# Patient Record
Sex: Male | Born: 1960 | Race: White | Hispanic: No | Marital: Married | Smoking: Light tobacco smoker
Health system: Southern US, Community
[De-identification: ages and names within clinical notes are randomized; demographics above are authoritative.]

## PROBLEM LIST (undated history)

## (undated) DIAGNOSIS — I499 Cardiac arrhythmia, unspecified: Secondary | ICD-10-CM

## (undated) DIAGNOSIS — E119 Type 2 diabetes mellitus without complications: Secondary | ICD-10-CM

## (undated) DIAGNOSIS — M1990 Unspecified osteoarthritis, unspecified site: Secondary | ICD-10-CM

## (undated) DIAGNOSIS — I10 Essential (primary) hypertension: Secondary | ICD-10-CM

## (undated) HISTORY — PX: COLONOSCOPY: SHX174

## (undated) HISTORY — PX: WISDOM TOOTH EXTRACTION: SHX21

## (undated) HISTORY — PX: CARPAL TUNNEL RELEASE: SHX101

## (undated) HISTORY — PX: VARICOSE VEIN SURGERY: SHX832

---

## 2020-03-11 ENCOUNTER — Ambulatory Visit: Payer: Self-pay

## 2020-03-11 ENCOUNTER — Encounter: Payer: Self-pay | Admitting: Orthopaedic Surgery

## 2020-03-11 ENCOUNTER — Other Ambulatory Visit: Payer: Self-pay

## 2020-03-11 ENCOUNTER — Ambulatory Visit: Payer: BC Managed Care – PPO | Admitting: Orthopaedic Surgery

## 2020-03-11 VITALS — Ht 72.0 in | Wt 348.0 lb

## 2020-03-11 DIAGNOSIS — M25551 Pain in right hip: Secondary | ICD-10-CM

## 2020-03-11 DIAGNOSIS — M1611 Unilateral primary osteoarthritis, right hip: Secondary | ICD-10-CM

## 2020-03-11 NOTE — Progress Notes (Signed)
Office Visit Note   Patient: Jorge Shaw           Date of Birth: March 20, 1961           MRN: [id number] Visit Date: 03/11/2020              Requested by: No referring provider defined for this encounter. PCP: Patient, No Pcp Per   Assessment & Plan: Visit Diagnoses:  1. Right hip pain   2. Unilateral primary osteoarthritis, right hip     Plan: I did talk to him in length about hip replacement surgery.  I discussed the risk and benefits of the surgery in detail.  We talked about anterior hip surgery and the goals of our surgery.  I talked about the heightened risk of implant failure and dislocation as well as soft tissue issues given his morbid obesity.  What I am most encouraged by is the fact that he has lost 100 pounds in just over a year and he has had any in the right direction and is motivated.  I feel comfortable with proceeding with anterior hip surgery at this point based on his clinical exam findings and x-ray findings.  He will let us know if he would like to have this scheduled.  All questions and concerns were answered and addressed.  He has our surgery scheduler's card.    Follow-Up Instructions: Return if symptoms worsen or fail to improve.   Orders:  Orders Placed This Encounter  Procedures  . XR HIP UNILAT W OR W/O PELVIS 1V RIGHT   No orders of the defined types were placed in this encounter.     Procedures: No procedures performed   Clinical Data: No additional findings.   Subjective: No chief complaint on file. The patient is a very pleasant 59 year old gentleman that I am seeing for the first time as it relates to severe end-stage arthritis of his right hip.  He ambulates with a cane.  His pain is debilitating in his right hip and it is 10 out of 10.  It is gotten worse on a daily basis.  His right hip pain is detrimentally affecting his quality of life, his activities day living and his mobility.  He is being seen today as a second opinion.  His other  orthopedic surgeon wants him to be at 300 pounds before considering surgery.  It is noted that the patient is already lost 100 pounds since he began his weight loss journey.  This is been through diet and he works with a specialist for this.  He reports a hemoglobin A1c of below 6.  He has tried and failed all forms conservative treatment as it relates to his right hip pain.  His left hip does not hurt at all  HPI  Review of Systems He currently denies any headache, chest pain, shortness of breath, fever, chills, nausea, vomiting  Objective: Vital Signs: Ht 6' (1.829 m)   Wt (!) 348 lb (157.9 kg)   BMI 47.20 kg/m   Physical Exam He is alert and orient x3 and in no acute distress Ortho Exam I examined him both standing and laying down.  His left hip exam is normal.  His right hip has significant limitations with internal and ex rotation as well as severe pain with rotation.  When I have him lay in a supine position you can absolutely tell that he is lost about amount of weight.  I can easily mobilize his abdomen and he has a  very thin thigh and I feel comfortable with being able to get through the soft tissue planes to get a stable hip replacement in him. Specialty Comments:  No specialty comments available.  Imaging: XR HIP UNILAT W OR W/O PELVIS 1V RIGHT  Result Date: 03/11/2020 An AP pelvis and lateral of the right hip shows severe end-stage arthritis of the right hip.  There is cystic changes in the acetabulum and femoral head as well as complete loss of joint space.    PMFS History: There are no problems to display for this patient.  History reviewed. No pertinent past medical history.  History reviewed. No pertinent family history.  History reviewed. No pertinent surgical history. Social History   Occupational History  . Not on file  Tobacco Use  . Smoking status: Not on file  Substance and Sexual Activity  . Alcohol use: Not on file  . Drug use: Not on file  . Sexual  activity: Not on file

## 2020-04-09 ENCOUNTER — Telehealth: Payer: Self-pay | Admitting: Orthopaedic Surgery

## 2020-04-09 NOTE — Telephone Encounter (Signed)
Pt called stating he needed a note excusing him from work starting 04/20/20 and pt stated hhe would like this note emailed. Pt asked for a call back when it's been sent and would like the note to be sent to both emails provided:  [email or web address] [phone]

## 2020-04-10 NOTE — Telephone Encounter (Signed)
Can you help me and email the note is wrote today in his chart to him please? [email or web address]

## 2020-04-13 NOTE — Telephone Encounter (Signed)
done

## 2020-04-15 ENCOUNTER — Telehealth: Payer: Self-pay | Admitting: Orthopaedic Surgery

## 2020-04-15 NOTE — Telephone Encounter (Signed)
Can you send this to Vern for me please

## 2020-04-15 NOTE — Telephone Encounter (Signed)
Pt called in stating he filled out FMLA paperwork on 04/15/20 and forgot to attach the fax numbers of the places he needed them sent. Pt stated he needed them sent to his job and [employer].  Work Fax# [phone] Insurance Fax# [phone]

## 2020-04-16 ENCOUNTER — Other Ambulatory Visit: Payer: Self-pay | Admitting: Physician Assistant

## 2020-04-16 NOTE — Patient Instructions (Addendum)
DUE TO COVID-19 ONLY ONE VISITOR IS ALLOWED TO COME WITH YOU AND STAY IN THE WAITING ROOM ONLY DURING PRE OP AND PROCEDURE DAY OF SURGERY. THE 1 VISITOR MAY VISIT WITH YOU AFTER SURGERY IN YOUR PRIVATE ROOM DURING VISITING HOURS ONLY!  YOU NEED TO HAVE A COVID 19 TEST ON 04-21-20 @ 11:55 AM, THIS TEST MUST BE DONE BEFORE SURGERY, COME  [address], [address] , [id number].  ([place]) ONCE YOUR COVID TEST IS COMPLETED, PLEASE BEGIN THE QUARANTINE INSTRUCTIONS AS OUTLINED IN YOUR HANDOUT.                [facility]  04/16/2020   Your procedure is scheduled on: 04-24-20   Report to [facility] Main  Entrance    Report to Admitting at 8:30 AM     Call this number if you have problems the morning of surgery [phone]    Remember: MIDNIGHT THE NIGHT PRIOR TO SURGERY. NOTHING BY MOUTH EXCEPT CLEAR LIQUIDS UNTIL 8:00 AM . PLEASE FINISH G2 DRINK PER SURGEON ORDER  WHICH NEEDS TO BE COMPLETED AT 8:00 AM .   CLEAR LIQUID DIET   Foods Allowed                                                                     Foods Excluded  Coffee and tea, regular and decaf                             liquids that you cannot  Plain Jell-O any favor except red or purple                                           see through such as: Fruit ices (not with fruit pulp)                                     milk, soups, orange juice  Iced Popsicles                                    All solid food Carbonated beverages, regular and diet                                    Cranberry, grape and apple juices Sports drinks like Gatorade Lightly seasoned clear broth or consume(fat free) Sugar, honey syrup   _____________________________________________________________________     Take these medicines the morning of surgery with A SIP OF WATER:  Finasteride (Proscar), Gabapentin (Neurontin), and Tamsulosin  BRUSH YOUR TEETH MORNING OF SURGERY AND RINSE YOUR MOUTH OUT, NO CHEWING GUM  CANDY OR MINTS.                              You may not have any metal on your body including hair pins and  piercings     Do not wear jewelry, cologne, lotions, powders or deodorant                          Men may shave face and neck.   Do not bring valuables to the hospital. [facility] IS NOT             RESPONSIBLE   FOR VALUABLES.  Contacts, dentures or bridgework may not be worn into surgery.  You may bring overnight bag      Special Instructions: N/A              Please read over the following fact sheets you were given: _____________________________________________________________________             [facility] - Preparing for Surgery Before surgery, you can play an important role.  Because skin is not sterile, your skin needs to be as free of germs as possible.  You can reduce the number of germs on your skin by washing with CHG (chlorahexidine gluconate) soap before surgery.  CHG is an antiseptic cleaner which kills germs and bonds with the skin to continue killing germs even after washing. Please DO NOT use if you have an allergy to CHG or antibacterial soaps.  If your skin becomes reddened/irritated stop using the CHG and inform your nurse when you arrive at Short Stay. Do not shave (including legs and underarms) for at least 48 hours prior to the first CHG shower.  You may shave your face/neck. Please follow these instructions carefully:  1.  Shower with CHG Soap the night before surgery and the  morning of Surgery.  2.  If you choose to wash your hair, wash your hair first as usual with your  normal  shampoo.  3.  After you shampoo, rinse your hair and body thoroughly to remove the  shampoo.                           4.  Use CHG as you would any other liquid soap.  You can apply chg directly  to the skin and wash                       Gently with a scrungie or clean washcloth.  5.  Apply the CHG Soap to your body ONLY FROM THE NECK DOWN.   Do not use on  face/ open                           Wound or open sores. Avoid contact with eyes, ears mouth and genitals (private parts).                       Wash face,  Genitals (private parts) with your normal soap.             6.  Wash thoroughly, paying special attention to the area where your surgery  will be performed.  7.  Thoroughly rinse your body with warm water from the neck down.  8.  DO NOT shower/wash with your normal soap after using and rinsing off  the CHG Soap.                9.  Pat yourself dry with a clean towel.            10.  Wear clean pajamas.            11.  Place clean sheets on your bed the night of your first shower and do not  sleep with pets. Day of Surgery : Do not apply any lotions/deodorants the morning of surgery.  Please wear clean clothes to the hospital/surgery center.  FAILURE TO FOLLOW THESE INSTRUCTIONS MAY RESULT IN THE CANCELLATION OF YOUR SURGERY PATIENT SIGNATURE_________________________________  NURSE SIGNATURE__________________________________  ________________________________________________________________________   Rogelia Mire  An incentive spirometer is a tool that can help keep your lungs clear and active. This tool measures how well you are filling your lungs with each breath. Taking long deep breaths may help reverse or decrease the chance of developing breathing (pulmonary) problems (especially infection) following:  A long period of time when you are unable to move or be active. BEFORE THE PROCEDURE   If the spirometer includes an indicator to show your best effort, your nurse or respiratory therapist will set it to a desired goal.  If possible, sit up straight or lean slightly forward. Try not to slouch.  Hold the incentive spirometer in an upright position. INSTRUCTIONS FOR USE  1. Sit on the edge of your bed if possible, or sit up as far as you can in bed or on a chair. 2. Hold the incentive spirometer in an upright  position. 3. Breathe out normally. 4. Place the mouthpiece in your mouth and seal your lips tightly around it. 5. Breathe in slowly and as deeply as possible, raising the piston or the ball toward the top of the column. 6. Hold your breath for 3-5 seconds or for as long as possible. Allow the piston or ball to fall to the bottom of the column. 7. Remove the mouthpiece from your mouth and breathe out normally. 8. Rest for a few seconds and repeat Steps 1 through 7 at least 10 times every 1-2 hours when you are awake. Take your time and take a few normal breaths between deep breaths. 9. The spirometer may include an indicator to show your best effort. Use the indicator as a goal to work toward during each repetition. 10. After each set of 10 deep breaths, practice coughing to be sure your lungs are clear. If you have an incision (the cut made at the time of surgery), support your incision when coughing by placing a pillow or rolled up towels firmly against it. Once you are able to get out of bed, walk around indoors and cough well. You may stop using the incentive spirometer when instructed by your caregiver.  RISKS AND COMPLICATIONS  Take your time so you do not get dizzy or light-headed.  If you are in pain, you may need to take or ask for pain medication before doing incentive spirometry. It is harder to take a deep breath if you are having pain. AFTER USE  Rest and breathe slowly and easily.  It can be helpful to keep track of a log of your progress. Your caregiver can provide you with a simple table to help with this. If you are using the spirometer at home, follow these instructions: SEEK MEDICAL CARE IF:   You are having difficultly using the spirometer.  You have trouble using the spirometer as often as instructed.  Your pain medication is not giving enough relief while using the spirometer.  You develop fever of 100.5 F (38.1 C) or higher. SEEK IMMEDIATE MEDICAL CARE IF:    You cough up bloody sputum that  had not been present before.  You develop fever of 102 F (38.9 C) or greater.  You develop worsening pain at or near the incision site. MAKE SURE YOU:   Understand these instructions.  Will watch your condition.  Will get help right away if you are not doing well or get worse. Document Released: 04/03/2007 Document Revised: 02/13/2012 Document Reviewed: 06/04/2007 ExitCare Patient Information 2014 ExitCare, [place].   ________________________________________________________________________  WHAT IS A BLOOD TRANSFUSION? Blood Transfusion Information  A transfusion is the replacement of blood or some of its parts. Blood is made up of multiple cells which provide different functions.  Red blood cells carry oxygen and are used for blood loss replacement.  White blood cells fight against infection.  Platelets control bleeding.  Plasma helps clot blood.  Other blood products are available for specialized needs, such as hemophilia or other clotting disorders. BEFORE THE TRANSFUSION  Who gives blood for transfusions?   Healthy volunteers who are fully evaluated to make sure their blood is safe. This is blood bank blood. Transfusion therapy is the safest it has ever been in the practice of medicine. Before blood is taken from a donor, a complete history is taken to make sure that person has no history of diseases nor engages in risky social behavior (examples are intravenous drug use or sexual activity with multiple partners). The donor's travel history is screened to minimize risk of transmitting infections, such as malaria. The donated blood is tested for signs of infectious diseases, such as HIV and hepatitis. The blood is then tested to be sure it is compatible with you in order to minimize the chance of a transfusion reaction. If you or a relative donates blood, this is often done in anticipation of surgery and is not appropriate for emergency  situations. It takes many days to process the donated blood. RISKS AND COMPLICATIONS Although transfusion therapy is very safe and saves many lives, the main dangers of transfusion include:   Getting an infectious disease.  Developing a transfusion reaction. This is an allergic reaction to something in the blood you were given. Every precaution is taken to prevent this. The decision to have a blood transfusion has been considered carefully by your caregiver before blood is given. Blood is not given unless the benefits outweigh the risks. AFTER THE TRANSFUSION  Right after receiving a blood transfusion, you will usually feel much better and more energetic. This is especially true if your red blood cells have gotten low (anemic). The transfusion raises the level of the red blood cells which carry oxygen, and this usually causes an energy increase.  The nurse administering the transfusion will monitor you carefully for complications. HOME CARE INSTRUCTIONS  No special instructions are needed after a transfusion. You may find your energy is better. Speak with your caregiver about any limitations on activity for underlying diseases you may have. SEEK MEDICAL CARE IF:   Your condition is not improving after your transfusion.  You develop redness or irritation at the intravenous (IV) site. SEEK IMMEDIATE MEDICAL CARE IF:  Any of the following symptoms occur over the next 12 hours:  Shaking chills.  You have a temperature by mouth above 102 F (38.9 C), not controlled by medicine.  Chest, back, or muscle pain.  People around you feel you are not acting correctly or are confused.  Shortness of breath or difficulty breathing.  Dizziness and fainting.  You get a rash or develop hives.  You have a decrease in urine  output.  Your urine turns a dark color or changes to pink, red, or brown. Any of the following symptoms occur over the next 10 days:  You have a temperature by mouth above  102 F (38.9 C), not controlled by medicine.  Shortness of breath.  Weakness after normal activity.  The white part of the eye turns yellow (jaundice).  You have a decrease in the amount of urine or are urinating less often.  Your urine turns a dark color or changes to pink, red, or brown. Document Released: 11/18/2000 Document Revised: 02/13/2012 Document Reviewed: 07/07/2008 [facility] Patient Information 2014 [place], [place].  _______________________________________________________________________

## 2020-04-16 NOTE — Progress Notes (Signed)
PCP - Dr. Leontine Locket [facility]  Cardiologist - Dr/ Asif Willeen Cass LOV 01-24-20  Chest x-ray -  EKG - 01-24-20 Care Everywhere Stress Test -  ECHO - 3-31 21 Care Everywhere Cardiac Cath -   Sleep Study -  CPAP -   Fasting Blood Sugar -  Checks Blood Sugar _____ times a day  Blood Thinner Instructions: Aspirin Instructions:81 mg ASA Last Dose:  Anesthesia review:   Patient denies shortness of breath, fever, cough and chest pain at PAT appointment   Patient verbalized understanding of instructions that were given to them at the PAT appointment. Patient was also instructed that they will need to review over the PAT instructions again at home before surgery.

## 2020-04-20 ENCOUNTER — Other Ambulatory Visit: Payer: Self-pay

## 2020-04-21 ENCOUNTER — Other Ambulatory Visit (HOSPITAL_COMMUNITY)
Admission: RE | Admit: 2020-04-21 | Discharge: 2020-04-21 | Disposition: A | Discharge status: Home / Self Care | Payer: BC Managed Care – PPO | Source: Ambulatory Visit | Attending: Orthopaedic Surgery | Admitting: Orthopaedic Surgery

## 2020-04-21 ENCOUNTER — Encounter (HOSPITAL_COMMUNITY)
Admission: RE | Admit: 2020-04-21 | Discharge: 2020-04-21 | Disposition: A | Discharge status: Home / Self Care | Payer: BC Managed Care – PPO | Source: Ambulatory Visit | Attending: Orthopaedic Surgery | Admitting: Orthopaedic Surgery

## 2020-04-21 ENCOUNTER — Encounter (HOSPITAL_COMMUNITY): Payer: Self-pay

## 2020-04-21 ENCOUNTER — Other Ambulatory Visit: Payer: Self-pay

## 2020-04-21 DIAGNOSIS — Z01818 Encounter for other preprocedural examination: Secondary | ICD-10-CM | POA: Insufficient documentation

## 2020-04-21 DIAGNOSIS — Z20822 Contact with and (suspected) exposure to covid-19: Secondary | ICD-10-CM | POA: Insufficient documentation

## 2020-04-21 HISTORY — DX: Essential (primary) hypertension: I10

## 2020-04-21 HISTORY — DX: Cardiac arrhythmia, unspecified: I49.9

## 2020-04-21 HISTORY — DX: Unspecified osteoarthritis, unspecified site: M19.90

## 2020-04-21 HISTORY — DX: Type 2 diabetes mellitus without complications: E11.9

## 2020-04-21 LAB — CBC
HCT: 43.3 % (ref 39.0–52.0)
Hemoglobin: 14.8 g/dL (ref 13.0–17.0)
MCH: 29.9 pg (ref 26.0–34.0)
MCHC: 34.2 g/dL (ref 30.0–36.0)
MCV: 87.5 fL (ref 80.0–100.0)
Platelets: 208 10*3/uL (ref 150–400)
RBC: 4.95 MIL/uL (ref 4.22–5.81)
RDW: 14.6 % (ref 11.5–15.5)
WBC: 9.8 10*3/uL (ref 4.0–10.5)
nRBC: 0 % (ref 0.0–0.2)

## 2020-04-21 LAB — BASIC METABOLIC PANEL
Anion gap: 9 (ref 5–15)
BUN: 18 mg/dL (ref 8–23)
CO2: 30 mmol/L (ref 22–32)
Calcium: 9 mg/dL (ref 8.9–10.3)
Chloride: 104 mmol/L (ref 98–111)
Creatinine, Ser: 0.94 mg/dL (ref 0.61–1.24)
GFR calc Af Amer: >60 mL/min (ref >60)
GFR calc non Af Amer: >60 mL/min (ref >60)
Glucose, Bld: 101 mg/dL — ABNORMAL HIGH (ref 70–99)
Potassium: 3.1 mmol/L — ABNORMAL LOW (ref 3.5–5.1)
Sodium: 143 mmol/L (ref 135–145)

## 2020-04-21 LAB — SURGICAL PCR SCREEN
MRSA, PCR: NEGATIVE
Staphylococcus aureus: NEGATIVE

## 2020-04-21 LAB — ABO/RH: ABO/RH(D): B POS

## 2020-04-21 LAB — SARS CORONAVIRUS 2 (TAT 6-24 HRS): SARS Coronavirus 2: NEGATIVE

## 2020-04-22 LAB — HEMOGLOBIN A1C
Hgb A1c MFr Bld: 5.4 % (ref 4.8–5.6)
Mean Plasma Glucose: 108 mg/dL

## 2020-04-23 MED ORDER — DEXTROSE 5 % IV SOLN
3.0000 g | INTRAVENOUS | Status: AC
  Administered 2020-04-24: 3 g via INTRAVENOUS
  Filled 2020-04-23: qty 3

## 2020-04-24 ENCOUNTER — Inpatient Hospital Stay (HOSPITAL_COMMUNITY): Payer: BC Managed Care – PPO | Admitting: Physician Assistant

## 2020-04-24 ENCOUNTER — Inpatient Hospital Stay (HOSPITAL_COMMUNITY): Payer: BC Managed Care – PPO

## 2020-04-24 ENCOUNTER — Encounter (HOSPITAL_COMMUNITY)
Admission: RE | Disposition: A | Discharge status: Home / Self Care | Payer: Self-pay | Source: Home / Self Care | Attending: Orthopaedic Surgery

## 2020-04-24 ENCOUNTER — Inpatient Hospital Stay (HOSPITAL_COMMUNITY)
Admission: RE | Admit: 2020-04-24 | Discharge: 2020-04-26 | DRG: 470 | Disposition: A | Discharge status: Home Health | Payer: BC Managed Care – PPO | Attending: Orthopaedic Surgery | Admitting: Orthopaedic Surgery

## 2020-04-24 ENCOUNTER — Other Ambulatory Visit: Payer: Self-pay

## 2020-04-24 ENCOUNTER — Encounter (HOSPITAL_COMMUNITY): Payer: Self-pay | Admitting: Orthopaedic Surgery

## 2020-04-24 DIAGNOSIS — Z6841 Body Mass Index (BMI) 40.0 and over, adult: Secondary | ICD-10-CM | POA: Diagnosis not present

## 2020-04-24 DIAGNOSIS — Z79899 Other long term (current) drug therapy: Secondary | ICD-10-CM

## 2020-04-24 DIAGNOSIS — Z20822 Contact with and (suspected) exposure to covid-19: Secondary | ICD-10-CM | POA: Diagnosis present

## 2020-04-24 DIAGNOSIS — Z96641 Presence of right artificial hip joint: Secondary | ICD-10-CM

## 2020-04-24 DIAGNOSIS — Z7982 Long term (current) use of aspirin: Secondary | ICD-10-CM

## 2020-04-24 DIAGNOSIS — M1711 Unilateral primary osteoarthritis, right knee: Secondary | ICD-10-CM | POA: Diagnosis present

## 2020-04-24 DIAGNOSIS — M1611 Unilateral primary osteoarthritis, right hip: Secondary | ICD-10-CM | POA: Diagnosis present

## 2020-04-24 DIAGNOSIS — I10 Essential (primary) hypertension: Secondary | ICD-10-CM | POA: Diagnosis present

## 2020-04-24 DIAGNOSIS — E119 Type 2 diabetes mellitus without complications: Secondary | ICD-10-CM | POA: Diagnosis present

## 2020-04-24 DIAGNOSIS — Z419 Encounter for procedure for purposes other than remedying health state, unspecified: Secondary | ICD-10-CM

## 2020-04-24 DIAGNOSIS — F17210 Nicotine dependence, cigarettes, uncomplicated: Secondary | ICD-10-CM | POA: Diagnosis present

## 2020-04-24 HISTORY — PX: TOTAL HIP ARTHROPLASTY: SHX124

## 2020-04-24 LAB — GLUCOSE, CAPILLARY
Glucose-Capillary: 105 mg/dL — ABNORMAL HIGH (ref 70–99)
Glucose-Capillary: 143 mg/dL — ABNORMAL HIGH (ref 70–99)

## 2020-04-24 LAB — TYPE AND SCREEN
ABO/RH(D): B POS
Antibody Screen: NEGATIVE

## 2020-04-24 SURGERY — ARTHROPLASTY, HIP, TOTAL, ANTERIOR APPROACH
Anesthesia: General | Site: Hip | Laterality: Right

## 2020-04-24 MED ORDER — PROPOFOL 500 MG/50ML IV EMUL
INTRAVENOUS | Status: AC
  Filled 2020-04-24: qty 50

## 2020-04-24 MED ORDER — DIPHENHYDRAMINE HCL 12.5 MG/5ML PO ELIX: 12.5000 mg | ORAL_SOLUTION | ORAL | Status: DC | PRN

## 2020-04-24 MED ORDER — ROCURONIUM BROMIDE 10 MG/ML (PF) SYRINGE
PREFILLED_SYRINGE | INTRAVENOUS | Status: DC | PRN
  Administered 2020-04-24 (×2): 30 mg via INTRAVENOUS

## 2020-04-24 MED ORDER — PROPOFOL 10 MG/ML IV BOLUS
INTRAVENOUS | Status: DC | PRN
  Administered 2020-04-24: 200 mg via INTRAVENOUS
  Administered 2020-04-24: 70 mg via INTRAVENOUS

## 2020-04-24 MED ORDER — EPHEDRINE 5 MG/ML INJ
INTRAVENOUS | Status: AC
  Filled 2020-04-24: qty 10

## 2020-04-24 MED ORDER — PANTOPRAZOLE SODIUM 40 MG PO TBEC
40.0000 mg | DELAYED_RELEASE_TABLET | Freq: Every day | ORAL | Status: DC
  Administered 2020-04-24 – 2020-04-26 (×3): 40 mg via ORAL
  Filled 2020-04-24 (×3): qty 1

## 2020-04-24 MED ORDER — DEXAMETHASONE SODIUM PHOSPHATE 10 MG/ML IJ SOLN
INTRAMUSCULAR | Status: DC | PRN
  Administered 2020-04-24: 8 mg via INTRAVENOUS

## 2020-04-24 MED ORDER — POTASSIUM CHLORIDE ER 10 MEQ PO TBCR
10.0000 meq | EXTENDED_RELEASE_TABLET | Freq: Every day | ORAL | Status: DC
  Administered 2020-04-25 – 2020-04-26 (×2): 10 meq via ORAL
  Filled 2020-04-24 (×4): qty 1

## 2020-04-24 MED ORDER — LIDOCAINE 2% (20 MG/ML) 5 ML SYRINGE
INTRAMUSCULAR | Status: AC
  Filled 2020-04-24: qty 5

## 2020-04-24 MED ORDER — KETOROLAC TROMETHAMINE 15 MG/ML IJ SOLN
INTRAMUSCULAR | Status: AC
  Administered 2020-04-24: 15 mg via INTRAVENOUS
  Filled 2020-04-24: qty 1

## 2020-04-24 MED ORDER — ACETAMINOPHEN 325 MG PO TABS
325.0000 mg | ORAL_TABLET | Freq: Four times a day (QID) | ORAL | Status: DC | PRN
  Administered 2020-04-26: 650 mg via ORAL
  Filled 2020-04-24: qty 2

## 2020-04-24 MED ORDER — CEFAZOLIN SODIUM-DEXTROSE 2-4 GM/100ML-% IV SOLN
2.0000 g | Freq: Four times a day (QID) | INTRAVENOUS | Status: AC
  Administered 2020-04-24 (×2): 2 g via INTRAVENOUS
  Filled 2020-04-24 (×2): qty 100

## 2020-04-24 MED ORDER — 0.9 % SODIUM CHLORIDE (POUR BTL) OPTIME
TOPICAL | Status: DC | PRN
  Administered 2020-04-24: 1000 mL

## 2020-04-24 MED ORDER — ONDANSETRON HCL 4 MG PO TABS: 4.0000 mg | ORAL_TABLET | Freq: Four times a day (QID) | ORAL | Status: DC | PRN

## 2020-04-24 MED ORDER — METHOCARBAMOL 500 MG IVPB - SIMPLE MED
INTRAVENOUS | Status: AC
  Administered 2020-04-24: 500 mg via INTRAVENOUS
  Filled 2020-04-24: qty 50

## 2020-04-24 MED ORDER — HYDROMORPHONE HCL 1 MG/ML IJ SOLN
0.2500 mg | INTRAMUSCULAR | Status: DC | PRN
  Administered 2020-04-24 (×2): 0.5 mg via INTRAVENOUS

## 2020-04-24 MED ORDER — GABAPENTIN 400 MG PO CAPS
1200.0000 mg | ORAL_CAPSULE | Freq: Three times a day (TID) | ORAL | Status: DC
  Administered 2020-04-24 – 2020-04-26 (×6): 1200 mg via ORAL
  Filled 2020-04-24 (×6): qty 3

## 2020-04-24 MED ORDER — OXYCODONE HCL 5 MG PO TABS
5.0000 mg | ORAL_TABLET | ORAL | Status: DC | PRN
  Administered 2020-04-24 – 2020-04-25 (×3): 10 mg via ORAL
  Filled 2020-04-24 (×3): qty 2

## 2020-04-24 MED ORDER — METHOCARBAMOL 500 MG PO TABS
500.0000 mg | ORAL_TABLET | Freq: Four times a day (QID) | ORAL | Status: DC | PRN
  Administered 2020-04-25 – 2020-04-26 (×4): 500 mg via ORAL
  Filled 2020-04-24 (×4): qty 1

## 2020-04-24 MED ORDER — PHENOL 1.4 % MT LIQD: 1.0000 | OROMUCOSAL | Status: DC | PRN

## 2020-04-24 MED ORDER — LOSARTAN POTASSIUM 50 MG PO TABS
100.0000 mg | ORAL_TABLET | Freq: Every day | ORAL | Status: DC
  Administered 2020-04-25 – 2020-04-26 (×2): 100 mg via ORAL
  Filled 2020-04-24 (×2): qty 2

## 2020-04-24 MED ORDER — SUGAMMADEX SODIUM 500 MG/5ML IV SOLN
INTRAVENOUS | Status: DC | PRN
  Administered 2020-04-24: 300 mg via INTRAVENOUS

## 2020-04-24 MED ORDER — HYDROMORPHONE HCL 1 MG/ML IJ SOLN
INTRAMUSCULAR | Status: AC
  Filled 2020-04-24: qty 1

## 2020-04-24 MED ORDER — EPHEDRINE SULFATE-NACL 50-0.9 MG/10ML-% IV SOSY
PREFILLED_SYRINGE | INTRAVENOUS | Status: DC | PRN
  Administered 2020-04-24: 10 mg via INTRAVENOUS

## 2020-04-24 MED ORDER — METOPROLOL SUCCINATE ER 100 MG PO TB24
100.0000 mg | ORAL_TABLET | Freq: Once | ORAL | Status: AC
  Administered 2020-04-24: 100 mg via ORAL
  Filled 2020-04-24: qty 1

## 2020-04-24 MED ORDER — SODIUM CHLORIDE 0.9 % IR SOLN
Status: DC | PRN
  Administered 2020-04-24: 1000 mL

## 2020-04-24 MED ORDER — LIDOCAINE 2% (20 MG/ML) 5 ML SYRINGE
INTRAMUSCULAR | Status: DC | PRN
  Administered 2020-04-24: 90 mg via INTRAVENOUS

## 2020-04-24 MED ORDER — CHLORHEXIDINE GLUCONATE 0.12 % MT SOLN
15.0000 mL | Freq: Once | OROMUCOSAL | Status: AC
  Administered 2020-04-24: 15 mL via OROMUCOSAL

## 2020-04-24 MED ORDER — ASPIRIN 81 MG PO CHEW
81.0000 mg | CHEWABLE_TABLET | Freq: Two times a day (BID) | ORAL | Status: DC
  Administered 2020-04-24 – 2020-04-26 (×4): 81 mg via ORAL
  Filled 2020-04-24 (×4): qty 1

## 2020-04-24 MED ORDER — DOCUSATE SODIUM 100 MG PO CAPS
100.0000 mg | ORAL_CAPSULE | Freq: Two times a day (BID) | ORAL | Status: DC
  Administered 2020-04-24 – 2020-04-26 (×5): 100 mg via ORAL
  Filled 2020-04-24 (×5): qty 1

## 2020-04-24 MED ORDER — HYDROMORPHONE HCL 1 MG/ML IJ SOLN
INTRAMUSCULAR | Status: AC
  Administered 2020-04-24: 0.5 mg via INTRAVENOUS
  Filled 2020-04-24: qty 1

## 2020-04-24 MED ORDER — ZOLPIDEM TARTRATE 5 MG PO TABS
5.0000 mg | ORAL_TABLET | Freq: Every evening | ORAL | Status: DC | PRN
  Administered 2020-04-24: 5 mg via ORAL
  Filled 2020-04-24: qty 1

## 2020-04-24 MED ORDER — SODIUM CHLORIDE 0.9 % IV SOLN: INTRAVENOUS | Status: DC

## 2020-04-24 MED ORDER — MIDAZOLAM HCL 2 MG/2ML IJ SOLN
INTRAMUSCULAR | Status: AC
  Filled 2020-04-24: qty 2

## 2020-04-24 MED ORDER — FUROSEMIDE 20 MG PO TABS
20.0000 mg | ORAL_TABLET | Freq: Every day | ORAL | Status: DC
  Administered 2020-04-25 – 2020-04-26 (×2): 20 mg via ORAL
  Filled 2020-04-24 (×2): qty 1

## 2020-04-24 MED ORDER — HYDROMORPHONE HCL 1 MG/ML IJ SOLN
0.5000 mg | INTRAMUSCULAR | Status: DC | PRN
  Administered 2020-04-25 (×2): 1 mg via INTRAVENOUS
  Filled 2020-04-24 (×2): qty 1

## 2020-04-24 MED ORDER — METOPROLOL SUCCINATE ER 50 MG PO TB24
100.0000 mg | ORAL_TABLET | Freq: Every day | ORAL | Status: DC
  Administered 2020-04-24 – 2020-04-26 (×2): 100 mg via ORAL
  Filled 2020-04-24 (×2): qty 2

## 2020-04-24 MED ORDER — FENTANYL CITRATE (PF) 250 MCG/5ML IJ SOLN
INTRAMUSCULAR | Status: AC
  Filled 2020-04-24: qty 5

## 2020-04-24 MED ORDER — PHENYLEPHRINE HCL-NACL 10-0.9 MG/250ML-% IV SOLN
INTRAVENOUS | Status: DC | PRN
  Administered 2020-04-24: 30 ug/min via INTRAVENOUS

## 2020-04-24 MED ORDER — FENTANYL CITRATE (PF) 100 MCG/2ML IJ SOLN
INTRAMUSCULAR | Status: DC | PRN
  Administered 2020-04-24 (×5): 50 ug via INTRAVENOUS

## 2020-04-24 MED ORDER — METHOCARBAMOL 500 MG IVPB - SIMPLE MED
500.0000 mg | Freq: Four times a day (QID) | INTRAVENOUS | Status: DC | PRN
  Filled 2020-04-24: qty 50

## 2020-04-24 MED ORDER — TRANEXAMIC ACID-NACL 1000-0.7 MG/100ML-% IV SOLN
1000.0000 mg | INTRAVENOUS | Status: AC
  Administered 2020-04-24: 1000 mg via INTRAVENOUS
  Filled 2020-04-24: qty 100

## 2020-04-24 MED ORDER — MIDAZOLAM HCL 2 MG/2ML IJ SOLN
INTRAMUSCULAR | Status: DC | PRN
  Administered 2020-04-24: 2 mg via INTRAVENOUS

## 2020-04-24 MED ORDER — POVIDONE-IODINE 10 % EX SWAB
2.0000 "application " | Freq: Once | CUTANEOUS | Status: AC
  Administered 2020-04-24: 2 via TOPICAL

## 2020-04-24 MED ORDER — LACTATED RINGERS IV SOLN: INTRAVENOUS | Status: DC

## 2020-04-24 MED ORDER — KETOROLAC TROMETHAMINE 15 MG/ML IJ SOLN
15.0000 mg | Freq: Four times a day (QID) | INTRAMUSCULAR | Status: AC
  Administered 2020-04-24 – 2020-04-25 (×3): 15 mg via INTRAVENOUS
  Filled 2020-04-24 (×3): qty 1

## 2020-04-24 MED ORDER — POLYETHYLENE GLYCOL 3350 17 G PO PACK: 17.0000 g | PACK | Freq: Every day | ORAL | Status: DC | PRN

## 2020-04-24 MED ORDER — ONDANSETRON HCL 4 MG/2ML IJ SOLN
INTRAMUSCULAR | Status: AC
  Filled 2020-04-24: qty 2

## 2020-04-24 MED ORDER — ATORVASTATIN CALCIUM 10 MG PO TABS
10.0000 mg | ORAL_TABLET | Freq: Every evening | ORAL | Status: DC
  Administered 2020-04-24 – 2020-04-25 (×2): 10 mg via ORAL
  Filled 2020-04-24 (×2): qty 1

## 2020-04-24 MED ORDER — ONDANSETRON HCL 4 MG/2ML IJ SOLN
INTRAMUSCULAR | Status: DC | PRN
  Administered 2020-04-24: 4 mg via INTRAVENOUS

## 2020-04-24 MED ORDER — PHENYLEPHRINE HCL (PRESSORS) 10 MG/ML IV SOLN
INTRAVENOUS | Status: AC
  Filled 2020-04-24: qty 1

## 2020-04-24 MED ORDER — SUCCINYLCHOLINE CHLORIDE 200 MG/10ML IV SOSY
PREFILLED_SYRINGE | INTRAVENOUS | Status: DC | PRN
  Administered 2020-04-24: 70 mg via INTRAVENOUS

## 2020-04-24 MED ORDER — ALUM & MAG HYDROXIDE-SIMETH 200-200-20 MG/5ML PO SUSP: 30.0000 mL | ORAL | Status: DC | PRN

## 2020-04-24 MED ORDER — FINASTERIDE 5 MG PO TABS
5.0000 mg | ORAL_TABLET | Freq: Every day | ORAL | Status: DC
  Administered 2020-04-25 – 2020-04-26 (×2): 5 mg via ORAL
  Filled 2020-04-24 (×2): qty 1

## 2020-04-24 MED ORDER — OXYCODONE HCL 5 MG PO TABS
10.0000 mg | ORAL_TABLET | ORAL | Status: DC | PRN
  Administered 2020-04-25 (×2): 15 mg via ORAL
  Administered 2020-04-25: 10 mg via ORAL
  Administered 2020-04-25 – 2020-04-26 (×4): 15 mg via ORAL
  Filled 2020-04-24 (×4): qty 3
  Filled 2020-04-24: qty 2
  Filled 2020-04-24 (×2): qty 3

## 2020-04-24 MED ORDER — PROPOFOL 1000 MG/100ML IV EMUL
INTRAVENOUS | Status: AC
  Filled 2020-04-24: qty 100

## 2020-04-24 MED ORDER — MENTHOL 3 MG MT LOZG: 1.0000 | LOZENGE | OROMUCOSAL | Status: DC | PRN

## 2020-04-24 MED ORDER — TAMSULOSIN HCL 0.4 MG PO CAPS
0.4000 mg | ORAL_CAPSULE | Freq: Every day | ORAL | Status: DC
  Administered 2020-04-25 – 2020-04-26 (×2): 0.4 mg via ORAL
  Filled 2020-04-24 (×2): qty 1

## 2020-04-24 MED ORDER — ONDANSETRON HCL 4 MG/2ML IJ SOLN: 4.0000 mg | Freq: Four times a day (QID) | INTRAMUSCULAR | Status: DC | PRN

## 2020-04-24 MED ORDER — METOCLOPRAMIDE HCL 5 MG/ML IJ SOLN: 5.0000 mg | Freq: Three times a day (TID) | INTRAMUSCULAR | Status: DC | PRN

## 2020-04-24 MED ORDER — DEXAMETHASONE SODIUM PHOSPHATE 10 MG/ML IJ SOLN
INTRAMUSCULAR | Status: AC
  Filled 2020-04-24: qty 1

## 2020-04-24 MED ORDER — AMLODIPINE BESYLATE 10 MG PO TABS
10.0000 mg | ORAL_TABLET | Freq: Every day | ORAL | Status: DC
  Administered 2020-04-25 – 2020-04-26 (×2): 10 mg via ORAL
  Filled 2020-04-24 (×2): qty 1

## 2020-04-24 MED ORDER — STERILE WATER FOR IRRIGATION IR SOLN
Status: DC | PRN
  Administered 2020-04-24: 2000 mL

## 2020-04-24 MED ORDER — ROCURONIUM BROMIDE 10 MG/ML (PF) SYRINGE
PREFILLED_SYRINGE | INTRAVENOUS | Status: AC
  Filled 2020-04-24: qty 10

## 2020-04-24 MED ORDER — PROPOFOL 10 MG/ML IV BOLUS
INTRAVENOUS | Status: AC
  Filled 2020-04-24: qty 20

## 2020-04-24 MED ORDER — SUGAMMADEX SODIUM 500 MG/5ML IV SOLN
INTRAVENOUS | Status: AC
  Filled 2020-04-24: qty 5

## 2020-04-24 MED ORDER — METOCLOPRAMIDE HCL 5 MG PO TABS: 5.0000 mg | ORAL_TABLET | Freq: Three times a day (TID) | ORAL | Status: DC | PRN

## 2020-04-24 SURGICAL SUPPLY — 41 items
ARTICULEZE HEAD (Hips) ×3 IMPLANT
BAG ZIPLOCK 12X15 (MISCELLANEOUS) IMPLANT
BENZOIN TINCTURE PRP APPL 2/3 (GAUZE/BANDAGES/DRESSINGS) IMPLANT
BLADE SAW SGTL 18X1.27X75 (BLADE) ×2 IMPLANT
BLADE SAW SGTL 18X1.27X75MM (BLADE) ×1
CLOSURE WOUND 1/2 X4 (GAUZE/BANDAGES/DRESSINGS)
COVER PERINEAL POST (MISCELLANEOUS) ×3 IMPLANT
COVER SURGICAL LIGHT HANDLE (MISCELLANEOUS) ×3 IMPLANT
COVER WAND RF STERILE (DRAPES) ×3 IMPLANT
CUP SECTOR GRIPTON 58MM (Orthopedic Implant) ×3 IMPLANT
DRAPE STERI IOBAN 125X83 (DRAPES) ×3 IMPLANT
DRAPE U-SHAPE 47X51 STRL (DRAPES) ×6 IMPLANT
DRSG AQUACEL AG ADV 3.5X10 (GAUZE/BANDAGES/DRESSINGS) ×3 IMPLANT
DURAPREP 26ML APPLICATOR (WOUND CARE) ×3 IMPLANT
ELECT REM PT RETURN 15FT ADLT (MISCELLANEOUS) ×3 IMPLANT
GAUZE XEROFORM 1X8 LF (GAUZE/BANDAGES/DRESSINGS) ×3 IMPLANT
GLOVE BIO SURGEON STRL SZ7.5 (GLOVE) ×3 IMPLANT
GLOVE BIOGEL PI IND STRL 8 (GLOVE) ×2 IMPLANT
GLOVE BIOGEL PI INDICATOR 8 (GLOVE) ×4
GLOVE ECLIPSE 8.0 STRL XLNG CF (GLOVE) ×3 IMPLANT
GOWN STRL REUS W/TWL XL LVL3 (GOWN DISPOSABLE) ×6 IMPLANT
HANDPIECE INTERPULSE COAX TIP (DISPOSABLE) ×2
HEAD ARTICULEZE (Hips) ×1 IMPLANT
HOLDER FOLEY CATH W/STRAP (MISCELLANEOUS) ×3 IMPLANT
KIT TURNOVER KIT A (KITS) IMPLANT
LINER NEUTRAL 52X36X58N (Liner) ×3 IMPLANT
PACK ANTERIOR HIP CUSTOM (KITS) ×3 IMPLANT
PENCIL SMOKE EVACUATOR (MISCELLANEOUS) IMPLANT
SET HNDPC FAN SPRY TIP SCT (DISPOSABLE) ×1 IMPLANT
STAPLER VISISTAT 35W (STAPLE) IMPLANT
STEM CORAIL KA13 (Stem) ×3 IMPLANT
STRIP CLOSURE SKIN 1/2X4 (GAUZE/BANDAGES/DRESSINGS) IMPLANT
SUT ETHIBOND NAB CT1 #1 30IN (SUTURE) ×3 IMPLANT
SUT ETHILON 2 0 PS N (SUTURE) IMPLANT
SUT MNCRL AB 4-0 PS2 18 (SUTURE) IMPLANT
SUT VIC AB 0 CT1 36 (SUTURE) ×3 IMPLANT
SUT VIC AB 1 CT1 36 (SUTURE) ×3 IMPLANT
SUT VIC AB 2-0 CT1 27 (SUTURE) ×4
SUT VIC AB 2-0 CT1 TAPERPNT 27 (SUTURE) ×2 IMPLANT
TRAY FOLEY MTR SLVR 16FR STAT (SET/KITS/TRAYS/PACK) IMPLANT
YANKAUER SUCT BULB TIP 10FT TU (MISCELLANEOUS) ×3 IMPLANT

## 2020-04-24 NOTE — Brief Op Note (Signed)
04/24/2020  12:50 PM  PATIENT:  Jorge Shaw  59 y.o. male  PRE-OPERATIVE DIAGNOSIS:  right hip osteoarthritis  POST-OPERATIVE DIAGNOSIS:  right hip osteoarthritis  PROCEDURE:  Procedure(s): RIGHT TOTAL HIP ARTHROPLASTY ANTERIOR APPROACH (Right)  SURGEON:  Surgeon(s) and Role:    Kathryne Hitch, MD - Primary  PHYSICIAN ASSISTANT:  Rexene Edison, PA-C  ANESTHESIA:   general  EBL:  300 mL   COUNTS:  YES  DICTATION: .Other Dictation: Dictation Number [id number]  PLAN OF CARE: Admit to inpatient   PATIENT DISPOSITION:  PACU - hemodynamically stable.   Delay start of Pharmacological VTE agent (>24hrs) due to surgical blood loss or risk of bleeding: no

## 2020-04-24 NOTE — Anesthesia Procedure Notes (Addendum)
Procedure Name: Intubation Date/Time: 04/24/2020 11:24 AM Performed by: Belinda Block, MD Pre-anesthesia Checklist: Patient identified, Emergency Drugs available, Suction available and Patient being monitored Patient Re-evaluated:Patient Re-evaluated prior to induction Oxygen Delivery Method: Circle system utilized and Simple face mask Preoxygenation: Pre-oxygenation with 100% oxygen Induction Type: IV induction Ventilation: Mask ventilation without difficulty Laryngoscope Size: Miller and 2 Grade View: Grade II Tube type: Oral Tube size: 7.5 mm Number of attempts: 1 Airway Equipment and Method: Stylet and Oral airway Placement Confirmation: ETT inserted through vocal cords under direct vision,  positive ETCO2 and breath sounds checked- equal and bilateral Secured at: 22 cm Tube secured with: Tape Dental Injury: Teeth and Oropharynx as per pre-operative assessment  Future Recommendations: Recommend- induction with short-acting agent, and alternative techniques readily available Comments: DL x1 by SRNA with MAC 3 blade grade II view, but unable to place ETT tube. Blade taken out and patient masked 100% FiO2 by MD. MD DL with Sabra Heck 2 blade after and ETT placed

## 2020-04-24 NOTE — Anesthesia Postprocedure Evaluation (Signed)
Anesthesia Post Note  Patient: Jorge Shaw  Procedure(s) Performed: RIGHT TOTAL HIP ARTHROPLASTY ANTERIOR APPROACH (Right Hip)     Patient location during evaluation: PACU Anesthesia Type: General Level of consciousness: awake Pain management: pain level controlled Vital Signs Assessment: post-procedure vital signs reviewed and stable Respiratory status: spontaneous breathing Cardiovascular status: stable Postop Assessment: no apparent nausea or vomiting Anesthetic complications: no    Last Vitals:  Vitals:   04/24/20 1315 04/24/20 1330  BP: 120/79 115/76  Pulse: 69 67  Resp: 16 11  Temp:    SpO2: 94% 92%    Last Pain:  Vitals:   04/24/20 1330  TempSrc:   PainSc: 5                  Gifford Ballon

## 2020-04-24 NOTE — Transfer of Care (Signed)
Immediate Anesthesia Transfer of Care Note  Patient: Jorge Shaw  Procedure(s) Performed: RIGHT TOTAL HIP ARTHROPLASTY ANTERIOR APPROACH (Right Hip)  Patient Location: PACU  Anesthesia Type:General  Level of Consciousness: awake, alert  and oriented  Airway & Oxygen Therapy: Patient Spontanous Breathing and Patient connected to face mask oxygen  Post-op Assessment: Report given to RN, Post -op Vital signs reviewed and stable and Patient moving all extremities X 4  Post vital signs: Reviewed and stable  Last Vitals:  Vitals Value Taken Time  BP    Temp    Pulse    Resp    SpO2      Last Pain:  Vitals:   04/24/20 0945  TempSrc:   PainSc: 0-No pain      Patients Stated Pain Goal: 3 (04/24/20 0945)  Complications: No apparent anesthesia complications

## 2020-04-24 NOTE — Evaluation (Signed)
Physical Therapy Evaluation Patient Details Name: Jorge Shaw MRN: [id number] DOB: [birth year] Today's Date: 04/24/2020   History of Present Illness  Patient is 59 y.o. male s/p Rt THA anerior approach with PMH significant for HTN, DM, OA.  Clinical Impression  Jorge Shaw is a 59 y.o. male POD 0 s/p Rt THA. Patient reports independence with [facility] for mobility. Patient is now limited by functional impairments (see PT problem list below) and requires min-mod assist or bed mob due to pain and min assist for transfers and gait with RW. Patient was able to ambulate ~90 feet with RW and min assist. Patient instructed in exercise to facilitate ROM and circulation. Patient will benefit from continued skilled PT interventions to address impairments and progress towards PLOF. Acute PT will follow to progress mobility and stair training in preparation for safe discharge home.     Follow Up Recommendations Follow surgeon's recommendation for DC plan and follow-up therapies;Home health PT    Equipment Recommendations  Rolling walker with 5" wheels;3in1 (PT)(Pt needs wide walker and wide BSC)    Recommendations for Other Services       Precautions / Restrictions Precautions Precautions: Fall Restrictions Weight Bearing Restrictions: No RLE Weight Bearing: Weight bearing as tolerated      Mobility  Bed Mobility Overal bed mobility: Needs Assistance Bed Mobility: Supine to Sit     Supine to sit: Min assist;HOB elevated;Mod assist     General bed mobility comments: pt required cues for use of bed rail and to bring Rt LE to EOB. pt limited by burning pain and min-mod assist required to raise trunk up fully.  Transfers Overall transfer level: Needs assistance Equipment used: Rolling walker (2 wheeled) Transfers: Sit to/from Stand Sit to Stand: Min assist         General transfer comment: cues or safe hand placement/technique wiht RW, min assist to steady with rising.    Ambulation/Gait Ambulation/Gait assistance: Min assist Gait Distance (Feet): 90 Feet Assistive device: Rolling walker (2 wheeled) Gait Pattern/deviations: Step-to pattern;Decreased stride length Gait velocity: decreased   General Gait Details: cues for step pattern and proximity to RW, assist required to manage walker position.   Stairs            Wheelchair Mobility    Modified Rankin (Stroke Patients Only)       Balance Overall balance assessment: Needs assistance Sitting-balance support: Feet supported Sitting balance-Leahy Scale: Good     Standing balance support: During functional activity;Bilateral upper extremity supported Standing balance-Leahy Scale: Fair              Pertinent Vitals/Pain Pain Assessment: 0-10 Pain Score: 4  Pain Location: Rt hip Pain Descriptors / Indicators: Aching;Burning;Discomfort Pain Intervention(s): Monitored during session;Limited activity within patient's tolerance;Repositioned;Ice applied    Home Living Family/patient expects to be discharged to:: Private residence Living Arrangements: Alone Available Help at Discharge: Family(pt's sisters have offered to stay with him) Type of Home: House Home Access: Stairs to enter Entrance Stairs-Rails: Right Entrance Stairs-Number of Steps: 3 Home Layout: One level Home Equipment: Cane - single point      Prior Function Level of Independence: Independent with assistive device(s)         Comments: pt has been using SPC for mobility     Hand Dominance   Dominant Hand: Right    Extremity/Trunk Assessment   Upper Extremity Assessment Upper Extremity Assessment: Overall WFL for tasks assessed    Lower Extremity Assessment Lower Extremity Assessment: Overall WFL for  tasks assessed    Cervical / Trunk Assessment Cervical / Trunk Assessment: Normal  Communication   Communication: No difficulties  Cognition Arousal/Alertness: Awake/alert Behavior During Therapy:  WFL for tasks assessed/performed Overall Cognitive Status: Within Functional Limits for tasks assessed             General Comments      Exercises Total Joint Exercises Ankle Circles/Pumps: AROM;Both;20 reps;Seated Quad Sets: AROM;Right;10 reps;Seated Heel Slides: AROM;Right;10 reps;Seated   Assessment/Plan    PT Assessment Patient needs continued PT services  PT Problem List Decreased strength;Decreased range of motion;Decreased activity tolerance;Decreased balance;Decreased mobility;Decreased knowledge of use of DME;Obesity;Decreased knowledge of precautions       PT Treatment Interventions DME instruction;Gait training;Stair training;Functional mobility training;Therapeutic activities;Therapeutic exercise;Balance training;Patient/family education    PT Goals (Current goals can be found in the Care Plan section)  Acute Rehab PT Goals Patient Stated Goal: to recover and get back to work PT Goal Formulation: With patient Time For Goal Achievement: 05/01/20 Potential to Achieve Goals: Good    Frequency 7X/week    AM-PAC PT "6 Clicks" Mobility  Outcome Measure Help needed turning from your back to your side while in a flat bed without using bedrails?: A Little Help needed moving from lying on your back to sitting on the side of a flat bed without using bedrails?: A Lot Help needed moving to and from a bed to a chair (including a wheelchair)?: A Little Help needed standing up from a chair using your arms (e.g., wheelchair or bedside chair)?: A Little Help needed to walk in hospital room?: A Little Help needed climbing 3-5 steps with a railing? : A Little 6 Click Score: 17    End of Session Equipment Utilized During Treatment: Gait belt Activity Tolerance: Patient tolerated treatment well Patient left: in chair;with call bell/phone within reach;with chair alarm set;with family/visitor present Nurse Communication: Mobility status PT Visit Diagnosis: Muscle weakness  (generalized) (M62.81);Difficulty in walking, not elsewhere classified (R26.2)    Time: 5277-8242 PT Time Calculation (min) (ACUTE ONLY): 26 min   Charges:   PT Evaluation $PT Eval Low Complexity: 1 Low PT Treatments $Gait Training: 8-22 mins       Verner Mould, DPT Physical Therapist with [facility] [phone]  04/24/2020 4:55 PM

## 2020-04-24 NOTE — Anesthesia Preprocedure Evaluation (Addendum)
Anesthesia Evaluation  Patient identified by MRN, date of birth, ID band Patient awake    Reviewed: Allergy & Precautions, NPO status , Patient's Chart, lab work & pertinent test results  Airway Mallampati: II  TM Distance: >3 FB     Dental   Pulmonary Current Smoker and Patient abstained from smoking.,    breath sounds clear to auscultation       Cardiovascular hypertension, + dysrhythmias  Rhythm:Regular Rate:Normal     Neuro/Psych negative neurological ROS     GI/Hepatic negative GI ROS, Neg liver ROS,   Endo/Other  diabetes  Renal/GU      Musculoskeletal  (+) Arthritis ,   Abdominal   Peds  Hematology   Anesthesia Other Findings   Reproductive/Obstetrics                             Anesthesia Physical Anesthesia Plan  ASA: II  Anesthesia Plan: General   Post-op Pain Management:    Induction: Intravenous  PONV Risk Score and Plan: Ondansetron, Dexamethasone and Midazolam  Airway Management Planned: Oral ETT  Additional Equipment:   Intra-op Plan:   Post-operative Plan: Possible Post-op intubation/ventilation  Informed Consent: I have reviewed the patients History and Physical, chart, labs and discussed the procedure including the risks, benefits and alternatives for the proposed anesthesia with the patient or authorized representative who has indicated his/her understanding and acceptance.     Dental advisory given  Plan Discussed with: Anesthesiologist and CRNA  Anesthesia Plan Comments:       Anesthesia Quick Evaluation

## 2020-04-24 NOTE — H&P (Signed)
TOTAL HIP ADMISSION H&P  Patient is admitted for right total hip arthroplasty.  Subjective:  Chief Complaint: right hip pain  HPI: Jorge Shaw, 59 y.o. male, has a history of pain and functional disability in the right hip(s) due to arthritis and patient has failed non-surgical conservative treatments for greater than 12 weeks to include NSAID's and/or analgesics, corticosteriod injections, flexibility and strengthening excercises, supervised PT with diminished ADL's post treatment, use of assistive devices, weight reduction as appropriate and activity modification.  Onset of symptoms was gradual starting 3 years ago with gradually worsening course since that time.The patient noted no past surgery on the right hip(s).  Patient currently rates pain in the right hip at 10 out of 10 with activity. Patient has night pain, worsening of pain with activity and weight bearing, trendelenberg gait, pain that interfers with activities of daily living and pain with passive range of motion. Patient has evidence of subchondral cysts, subchondral sclerosis, periarticular osteophytes and joint space narrowing by imaging studies. This condition presents safety issues increasing the risk of falls.  There is no current active infection.  Patient Active Problem List   Diagnosis Date Noted  . Unilateral primary osteoarthritis, right hip 04/24/2020   Past Medical History:  Diagnosis Date  . Arthritis   . Diabetes mellitus without complication ([place])   . Dysrhythmia   . Hypertension     Past Surgical History:  Procedure Laterality Date  . CARPAL TUNNEL RELEASE Bilateral   . COLONOSCOPY    . VARICOSE VEIN SURGERY Left    left and ankle  . WISDOM TOOTH EXTRACTION      Current Facility-Administered Medications  Medication Dose Route Frequency Provider Last Rate Last Admin  . ceFAZolin (ANCEF) 3 g in dextrose 5 % 50 mL IVPB  3 g Intravenous On Call to Rosenhayn, MD       Current Outpatient  Medications  Medication Sig Dispense Refill Last Dose  . amLODipine (NORVASC) 10 MG tablet Take 10 mg by mouth daily.      Marland Kitchen aspirin 81 MG chewable tablet Chew 81 mg by mouth daily.      Marland Kitchen atorvastatin (LIPITOR) 10 MG tablet Take 10 mg by mouth every evening.      Marland Kitchen doxylamine, Sleep, (UNISOM) 25 MG tablet Take 50 mg by mouth at bedtime as needed for sleep.     . finasteride (PROSCAR) 5 MG tablet Take 5 mg by mouth daily.     . furosemide (LASIX) 20 MG tablet Take 20 mg by mouth daily.      Marland Kitchen gabapentin (NEURONTIN) 600 MG tablet Take 1,200 mg by mouth in the morning, at noon, and at bedtime.      Marland Kitchen HYDROcodone-acetaminophen (NORCO) 10-325 MG tablet Take 1 tablet by mouth every 8 (eight) hours as needed for moderate pain.      Marland Kitchen ibuprofen (ADVIL) 200 MG tablet Take 800 mg by mouth every 6 (six) hours as needed for headache or moderate pain.     Marland Kitchen losartan (COZAAR) 100 MG tablet Take 100 mg by mouth daily.      . methocarbamol (ROBAXIN) 500 MG tablet Take 500 mg by mouth 3 (three) times daily.      . metoprolol succinate (TOPROL-XL) 100 MG 24 hr tablet Take 100 mg by mouth daily.      . Multiple Vitamin (THERA) TABS Take 1 tablet by mouth daily.      . potassium chloride (KLOR-CON) 10 MEQ tablet Take 10 mEq  by mouth daily.     . Salicylic Acid (WART REMOVER EX) Apply 1 application topically daily as needed (warts).     . Semaglutide, 1 MG/DOSE, (OZEMPIC, 1 MG/DOSE,) 2 MG/1.5ML SOPN Inject 1 mg into the skin every Sunday.      . tamsulosin (FLOMAX) 0.4 MG CAPS capsule Take 0.4 mg by mouth daily.      . Tetrahydrozoline HCl (VISINE OP) Place 1 drop into both eyes daily as needed (irritation).     Marland Kitchen zolpidem (AMBIEN) 10 MG tablet Take 10 mg by mouth at bedtime as needed for sleep.       No Known Allergies  Social History   Tobacco Use  . Smoking status: Light Tobacco Smoker    Types: Cigarettes  . Smokeless tobacco: Current User  . Tobacco comment: 2-3 monthly  Substance Use Topics  .  Alcohol use: Yes    Alcohol/week: 3.0 standard drinks    Types: 1 Glasses of wine, 1 Cans of beer, 1 Shots of liquor per week    Comment: once a month    No family history on file.   Review of Systems  All other systems reviewed and are negative.   Objective:  Physical Exam  Constitutional: He is oriented to person, place, and time. He appears well-developed and well-nourished.  HENT:  Head: Normocephalic and atraumatic.  Eyes: Pupils are equal, round, and reactive to light. EOM are normal.  Cardiovascular: Normal rate.  Respiratory: Effort normal.  GI: Soft.  Musculoskeletal:     Cervical back: Normal range of motion and neck supple.     Right hip: Tenderness and bony tenderness present. Decreased range of motion. Decreased strength.  Neurological: He is alert and oriented to person, place, and time.  Skin: Skin is warm and dry.  Psychiatric: He has a normal mood and affect.    Vital signs in last 24 hours:    Labs:   Estimated body mass index is 47.47 kg/m as calculated from the following:   Height as of 04/21/20: 6' (1.829 m).   Weight as of 04/21/20: 158.8 kg.   Imaging Review Plain radiographs demonstrate severe degenerative joint disease of the right hip(s). The bone quality appears to be good for age and reported activity level.      Assessment/Plan:  End stage arthritis, right hip(s)  The patient history, physical examination, clinical judgement of the provider and imaging studies are consistent with end stage degenerative joint disease of the right hip(s) and total hip arthroplasty is deemed medically necessary. The treatment options including medical management, injection therapy, arthroscopy and arthroplasty were discussed at length. The risks and benefits of total hip arthroplasty were presented and reviewed. The risks due to aseptic loosening, infection, stiffness, dislocation/subluxation,  thromboembolic complications and other imponderables were  discussed.  The patient acknowledged the explanation, agreed to proceed with the plan and consent was signed. Patient is being admitted for inpatient treatment for surgery, pain control, PT, OT, prophylactic antibiotics, VTE prophylaxis, progressive ambulation and ADL's and discharge planning.The patient is planning to be discharged home with home health services

## 2020-04-25 LAB — BASIC METABOLIC PANEL
Anion gap: 8 (ref 5–15)
BUN: 20 mg/dL (ref 8–23)
CO2: 28 mmol/L (ref 22–32)
Calcium: 8.3 mg/dL — ABNORMAL LOW (ref 8.9–10.3)
Chloride: 101 mmol/L (ref 98–111)
Creatinine, Ser: 0.75 mg/dL (ref 0.61–1.24)
GFR calc Af Amer: >60 mL/min (ref >60)
GFR calc non Af Amer: >60 mL/min (ref >60)
Glucose, Bld: 141 mg/dL — ABNORMAL HIGH (ref 70–99)
Potassium: 3 mmol/L — ABNORMAL LOW (ref 3.5–5.1)
Sodium: 137 mmol/L (ref 135–145)

## 2020-04-25 LAB — CBC
HCT: 37.1 % — ABNORMAL LOW (ref 39.0–52.0)
Hemoglobin: 12.4 g/dL — ABNORMAL LOW (ref 13.0–17.0)
MCH: 29.2 pg (ref 26.0–34.0)
MCHC: 33.4 g/dL (ref 30.0–36.0)
MCV: 87.5 fL (ref 80.0–100.0)
Platelets: 198 10*3/uL (ref 150–400)
RBC: 4.24 MIL/uL (ref 4.22–5.81)
RDW: 14.2 % (ref 11.5–15.5)
WBC: 12.8 10*3/uL — ABNORMAL HIGH (ref 4.0–10.5)
nRBC: 0 % (ref 0.0–0.2)

## 2020-04-25 MED ORDER — TEMAZEPAM 15 MG PO CAPS
15.0000 mg | ORAL_CAPSULE | Freq: Every evening | ORAL | Status: DC | PRN
  Administered 2020-04-25: 15 mg via ORAL
  Filled 2020-04-25: qty 1

## 2020-04-25 MED ORDER — ASPIRIN 81 MG PO CHEW: 81.0000 mg | CHEWABLE_TABLET | Freq: Two times a day (BID) | ORAL | 0 refills | Status: AC

## 2020-04-25 MED ORDER — METHOCARBAMOL 500 MG PO TABS: 500.0000 mg | ORAL_TABLET | Freq: Four times a day (QID) | ORAL | 0 refills | Status: DC | PRN

## 2020-04-25 MED ORDER — OXYCODONE HCL 5 MG PO TABS: 5.0000 mg | ORAL_TABLET | ORAL | 0 refills | Status: DC | PRN

## 2020-04-25 NOTE — TOC Initial Note (Signed)
Transition of Care [facility]) - Initial/Assessment Note    Patient Details  Name: Jorge Shaw MRN: [id number] Date of Birth: 1961-03-29  Transition of Care (TOC) CM/SW Contact:    Armanda Heritage, RN Phone Number: 04/25/2020, 1:50 PM  Clinical Narrative:   CM spoke with patient at bedside.  [facility] to provide home health physical therapy.  [employer] to deliver rolling walker and 3in1.                 Expected Discharge Plan: Home w Home Health Services Barriers to Discharge: Continued Medical Work up   Patient Goals and CMS Choice Patient states their goals for this hospitalization and ongoing recovery are:: go home with therapy CMS Medicare.gov Compare Post Acute Care list provided to:: Patient Choice offered to / list presented to : Patient  Expected Discharge Plan and Services Expected Discharge Plan: Home w Home Health Services   Discharge Planning Services: CM Consult Post Acute Care Choice: Home Health Living arrangements for the past 2 months: Single Family Home                 DME Arranged: Walker rolling, 3-N-1 DME Agency: [facility] Date DME Agency Contacted: 04/25/20 Time DME Agency Contacted: 1349 Representative spoke with at DME Agency: Ian Malkin HH Arranged: PT HH Agency: [facility] Date [facility] Agency Contacted: 04/25/20 Time HH Agency Contacted: 1350 Representative spoke with at [facility] Agency: Kandee Keen  Prior Living Arrangements/Services Living arrangements for the past 2 months: Single Family Home   Patient language and need for interpreter reviewed:: Yes Do you feel safe going back to the place where you live?: Yes      Need for Family Participation in Patient Care: Yes (Comment) Care giver support system in place?: Yes (comment)   Criminal Activity/Legal Involvement Pertinent to Current Situation/Hospitalization: No - Comment as needed  Activities of Daily Living Home Assistive Devices/Equipment: Cane (specify quad or straight), Grab bars in shower ADL  Screening (condition at time of admission) Patient's cognitive ability adequate to safely complete daily activities?: Yes Is the patient deaf or have difficulty hearing?: No Does the patient have difficulty seeing, even when wearing glasses/contacts?: No Does the patient have difficulty concentrating, remembering, or making decisions?: No Patient able to express need for assistance with ADLs?: Yes Does the patient have difficulty dressing or bathing?: No Independently performs ADLs?: Yes (appropriate for developmental age) Does the patient have difficulty walking or climbing stairs?: Yes Weakness of Legs: None Weakness of Arms/Hands: None  Permission Sought/Granted                  Emotional Assessment Appearance:: Appears stated age Attitude/Demeanor/Rapport: Engaged Affect (typically observed): Accepting Orientation: : Oriented to Self, Oriented to Place, Oriented to  Time, Oriented to Situation   Psych Involvement: No (comment)  Admission diagnosis:  Status post total replacement of right hip [Z96.641] Patient Active Problem List   Diagnosis Date Noted  . Unilateral primary osteoarthritis, right hip 04/24/2020  . Status post total replacement of right hip 04/24/2020   PCP:  Tarri Fuller, MD Pharmacy:   [facility] [phone] - [place], [place] - [address] AT [facility] & [address] [facility] [place] [address] Phone: [phone] Fax: [phone]     Social Determinants of Health (SDOH) Interventions    Readmission Risk Interventions No flowsheet data found.

## 2020-04-25 NOTE — Progress Notes (Signed)
Subjective: 1 Day Post-Op Procedure(s) (LRB): RIGHT TOTAL HIP ARTHROPLASTY ANTERIOR APPROACH (Right) Patient reports pain as moderate.    Objective: Vital signs in last 24 hours: Temp:  [97.9 F (36.6 C)-98.8 F (37.1 C)] 98.3 F (36.8 C) (05/22 1319) Pulse Rate:  [61-72] 70 (05/22 1319) Resp:  [11-20] 18 (05/22 1319) BP: (114-144)/(78-89) 133/87 (05/22 1319) SpO2:  [94 %-100 %] 97 % (05/22 1319) FiO2 (%):  [21 %] 21 % (05/22 0520)  Intake/Output from previous day: 05/21 0701 - 05/22 0700 In: 4173.6 [P.O.:900; I.V.:2873.6; IV Piggyback:400] Out: 1375 [Urine:1075; Blood:300] Intake/Output this shift: Total I/O In: 1073.2 [P.O.:960; I.V.:113.2] Out: 1100 [Urine:1100]  Recent Labs    04/25/20 0315  HGB 12.4*   Recent Labs    04/25/20 0315  WBC 12.8*  RBC 4.24  HCT 37.1*  PLT 198   Recent Labs    04/25/20 0315  NA 137  K 3.0*  CL 101  CO2 28  BUN 20  CREATININE 0.75  GLUCOSE 141*  CALCIUM 8.3*   No results for input(s): LABPT, INR in the last 72 hours.  Sensation intact distally Intact pulses distally Dorsiflexion/Plantar flexion intact Incision: dressing C/D/I   Assessment/Plan: 1 Day Post-Op Procedure(s) (LRB): RIGHT TOTAL HIP ARTHROPLASTY ANTERIOR APPROACH (Right) Up with therapy Plan for discharge tomorrow Discharge home with home health      Kathryne Hitch 04/25/2020, 2:08 PM

## 2020-04-25 NOTE — Plan of Care (Signed)
  Problem: Activity: Goal: Ability to avoid complications of mobility impairment will improve Outcome: Progressing   Problem: Clinical Measurements: Goal: Postoperative complications will be avoided or minimized Outcome: Progressing   Problem: Education: Goal: Knowledge of General Education information will improve Description: Including pain rating scale, medication(s)/side effects and non-pharmacologic comfort measures Outcome: Progressing   Problem: Health Behavior/Discharge Planning: Goal: Ability to manage health-related needs will improve Outcome: Progressing   Problem: Clinical Measurements: Goal: Respiratory complications will improve Outcome: Progressing   Problem: Nutrition: Goal: Adequate nutrition will be maintained Outcome: Progressing

## 2020-04-25 NOTE — Progress Notes (Signed)
Physical Therapy Treatment Patient Details Name: Jorge Shaw MRN: [id number] DOB: Jan 28, 1961 Today's Date: 04/25/2020    History of Present Illness Patient is 59 y.o. male s/p Rt THA anerior approach with PMH significant for HTN, DM, OA.    PT Comments    Pt is progressing well with therapy. Pt reported increased hip pain this afternoon stating he was almost due for pain meds. Pt did however agree to participate in therapy. Pt completed step training with min guard assist and ambulated down to the therapy gym with min guard assist with standing rest breaks due to pain. Pt has demonstrated good understanding of safety with mobility and has been given a HEP. Anticipate pt will be able to d/c in the morning after his therapy. Pt will continue to benefit from acute skilled PT to maximize mobility and independence until d/c home with family.  Follow Up Recommendations  Follow surgeon's recommendation for DC plan and follow-up therapies;Home health PT     Equipment Recommendations  Rolling walker with 5" wheels;3in1 (PT)    Recommendations for Other Services       Precautions / Restrictions Precautions Precautions: Anterior Hip Restrictions Weight Bearing Restrictions: No RLE Weight Bearing: Weight bearing as tolerated    Mobility  Bed Mobility Overal bed mobility: Needs Assistance Bed Mobility: Sit to Supine     Supine to sit: HOB elevated;Min assist     General bed mobility comments: cues for technique, assistance lifting LE into bed  Transfers Overall transfer level: Needs assistance Equipment used: Rolling walker (2 wheeled) Transfers: Sit to/from Stand Sit to Stand: Supervision         General transfer comment: cues or safe hand placement/technique wiht RW, min assist to steady with rising.   Ambulation/Gait Ambulation/Gait assistance: Min guard Gait Distance (Feet): 250 Feet Assistive device: Rolling walker (2 wheeled) Gait Pattern/deviations: Step-through  pattern;Decreased stride length Gait velocity: decreased   General Gait Details: standing rest breaks throughout walk due to pain   Stairs Stairs: Yes Stairs assistance: Min guard Stair Management: One rail Right;Sideways Number of Stairs: 3 General stair comments: cues for technique, handout given   Wheelchair Mobility    Modified Rankin (Stroke Patients Only)       Balance Overall balance assessment: Independent Sitting-balance support: No upper extremity supported Sitting balance-Leahy Scale: Good     Standing balance support: During functional activity;Bilateral upper extremity supported Standing balance-Leahy Scale: Fair                              Cognition Arousal/Alertness: Awake/alert Behavior During Therapy: WFL for tasks assessed/performed Overall Cognitive Status: Within Functional Limits for tasks assessed                                        Exercises Total Joint Exercises Ankle Circles/Pumps: AROM;Both;20 reps;Supine Quad Sets: AROM;Right;10 reps;Supine Heel Slides: AROM;Right;10 reps;Supine Hip ABduction/ADduction: AROM;Strengthening;Right;Supine    General Comments        Pertinent Vitals/Pain Pain Assessment: 0-10 Pain Score: 8  Pain Location: Rt hip Pain Descriptors / Indicators: Aching;Burning;Discomfort Pain Intervention(s): Limited activity within patient's tolerance;Repositioned;Monitored during session    Home Living                      Prior Function            PT  Goals (current goals can now be found in the care plan section) Progress towards PT goals: Progressing toward goals    Frequency    7X/week      PT Plan Current plan remains appropriate    Co-evaluation              AM-PAC PT "6 Clicks" Mobility   Outcome Measure  Help needed turning from your back to your side while in a flat bed without using bedrails?: A Little Help needed moving from lying on your back  to sitting on the side of a flat bed without using bedrails?: A Little Help needed moving to and from a bed to a chair (including a wheelchair)?: A Little Help needed standing up from a chair using your arms (e.g., wheelchair or bedside chair)?: A Little Help needed to walk in hospital room?: A Little Help needed climbing 3-5 steps with a railing? : A Little 6 Click Score: 18    End of Session Equipment Utilized During Treatment: Gait belt Activity Tolerance: Patient tolerated treatment well Patient left: with call bell/phone within reach;in bed Nurse Communication: Mobility status PT Visit Diagnosis: Muscle weakness (generalized) (M62.81);Difficulty in walking, not elsewhere classified (R26.2)     Time: 1540-0867 PT Time Calculation (min) (ACUTE ONLY): 28 min  Charges:  $Gait Training: 8-22 mins $Therapeutic Activity: 8-22 mins                       Lelon Mast 04/25/2020, 4:26 PM

## 2020-04-25 NOTE — Op Note (Signed)
NAMEErcel, Pepitone [facility] MEDICAL RECORD NO:[id number] ACCOUNT [id number] DATE OF BIRTH:Jul 03, 1961 FACILITY: [facility] LOCATION: [facility] PHYSICIAN:Tian Davison Kerry Fort, MD  OPERATIVE REPORT  DATE OF PROCEDURE:  04/24/2020  PREOPERATIVE DIAGNOSIS:  Primary osteoarthritis and degenerative joint disease, right hip.  POSTOPERATIVE DIAGNOSIS:  Primary osteoarthritis and degenerative joint disease, right hip.  PROCEDURE:  Right total hip arthroplasty through direct anterior approach.  IMPLANTS:  DePuy Sector Gription acetabular component size 58, size 36+0 neutral polyethylene liner, size 13 Corail femoral component with standard offset, size 36+5 metal hip ball.  SURGEON:  Lind Guest. Ninfa Linden, MD  ASSISTANT:  Erskine Emery, PA-C  ANESTHESIA:  General.  ANTIBIOTICS:  3 g IV Ancef.  ESTIMATED BLOOD LOSS:  300 mL.  COMPLICATIONS:  None.  INDICATIONS:  The patient is a 59 year old gentleman with morbid obesity and a BMI of 47.  He has debilitating arthritis involving both his hips with the right worse than the left and severe arthritis in his right knee.  He has lost 100 pounds and he is  still working on his weight loss journey, but at this point, his arthritis is detrimentally affecting his ability to mobilize well and this is causing a detrimental affect on his quality of life and his mobility in general.  His pain is daily and it is  severe.  I do feel that with his weight loss being as dramatic as it is, that he has been able to tolerate total joint arthroplasty surgery and hopefully continue to lose weight to maximize effects of weight loss on his body in general.  I had a long and  thorough discussion with him about hip replacement surgery.  His right hip arthritis is quite severe.  He understands that with his morbid obesity, there is at heightened risk of acute blood loss anemia, nerve or vessel injury, fracture, infection,  dislocation, DVT and implant failure.  He understands our goals  are to decrease pain, improve mobility and overall improve quality of life.  DESCRIPTION OF PROCEDURE:  After informed consent was obtained and appropriate right hip was marked, he was brought to the operating room.  General anesthesia was obtained while he was on a stretcher.  Traction boots were placed on both his feet.  Next,  he was placed supine on the Hana fracture table, with the perineal post in place, both legs in line skeletal traction device and no traction applied.  His right operative hip was prepped and draped with DuraPrep and sterile drapes.  A time-out was  called.  He was identified as correct patient, correct right hip.  I then made an incision just inferior and posterior to the anterior superior iliac spine and carried this obliquely down the leg.  We dissected down to the tensor fascia lata muscle.  The  tensor fascia was then divided longitudinally to proceed with direct anterior approach to the hip.  We identified and cauterized circumflex vessels and identified the hip capsule, opened up the hip capsule in an L-type format, finding moderate joint  effusion and significant disease around the femoral head and neck.  I placed Cobra retractors around the medial and lateral femoral neck and made our femoral neck cut with an oscillating saw just proximal to the lesser trochanter.  I completed this with  an osteotome.  I then placed a corkscrew guide in the femoral head and removed the femoral head in its entirety and found a wide area devoid of cartilage.  I then placed a bent Hohmann over the medial acetabular  rim and removed remnants of the acetabular  labrum and other debris from the acetabulum and then began reaming under direct visualization from a size 44 reamer in stepwise increments up to a size 57, with all reamers under direct visualization, the last reamer under direct fluoroscopy, so we  could obtain our depth of reaming, our inclination and anteversion.  I then placed the real  DePuy Sector Gription acetabular component size 58 and a 36+0 neutral polyethylene liner based on his offset.  Attention was then turned to the femur.  With the  leg externally rotated to 120 degrees, extended and adducted, we were able to place a Mueller retractor medially and a Hohman retractor behind the greater trochanter, released the lateral joint capsule and used a box-cutting osteotome to enter the  femoral canal and a rongeur to lateralize.  We then began broaching using Corail broaching system from a size 8 going all the way to size 13.  With the size 13 in place, we trialed a standard offset femoral neck and a 36+1.5 hip ball, reduced this in the  acetabulum and we felt like we needed just a little bit more leg length and some offset, but we were pleased with stability.  We dislocated the hip and removed the trial components.  We placed the real Corail femoral component size 13 with standard  offset and the real 36+5 metal hip ball and again reduced this in the acetabulum.  We were pleased with the stability, leg length and offset measured mechanically and radiographically.  We then irrigated the soft tissue with normal saline solution using  pulsatile lavage.  We were able to close the joint capsule with interrupted #1 Ethibond suture, #1 Vicryl was used to close the tensor fascia, 0 Vicryl was used to close the deep tissue, 2-0 Vicryl was used to close subcutaneous tissue.  The skin was  closed with staples.  Xeroform and Aquacel dressing were applied.  He was taken off the Hana table, awakened, extubated, and taken to recovery room in stable condition.  All final counts were correct.  There were no complications noted.  Of note, Rexene Edison, PA-C did assist during the entire case.  His assistance was crucial for assisting in all aspects of this case.  VN/NUANCE  D:04/24/2020 T:04/25/2020 JOB:[id number]

## 2020-04-25 NOTE — Discharge Instructions (Signed)

## 2020-04-25 NOTE — Progress Notes (Signed)
Physical Therapy Treatment Patient Details Name: Jorge Shaw MRN: [id number] DOB: 15-Sep-1961 Today's Date: 04/25/2020    History of Present Illness Patient is 59 y.o. male s/p Rt THA anerior approach with PMH significant for HTN, DM, OA.    PT Comments    Pt is progressing well with therapy. Pt is min assist-min guard assist with mobility. Pt completed HEP with Supervision. Pt reports he will have family able to provide care starting tomorrow. Pt is currently not independent with mobility therefore I recommend d/c tomorrow morning after therapy. Pt is agreeable to plan. Pt will continue to benefit from acute skilled PT to maximize mobility and Independence for d/c home.  Follow Up Recommendations  Follow surgeon's recommendation for DC plan and follow-up therapies;Home health PT     Equipment Recommendations  Rolling walker with 5" wheels;3in1 (PT)    Recommendations for Other Services       Precautions / Restrictions Precautions Precautions: Anterior Hip Restrictions Weight Bearing Restrictions: No RLE Weight Bearing: Weight bearing as tolerated    Mobility  Bed Mobility Overal bed mobility: Needs Assistance Bed Mobility: Supine to Sit     Supine to sit: HOB elevated;Min guard     General bed mobility comments: cues fior technique  Transfers Overall transfer level: Needs assistance Equipment used: Rolling walker (2 wheeled) Transfers: Sit to/from Stand Sit to Stand: Min guard         General transfer comment: cues or safe hand placement/technique wiht RW, min assist to steady with rising.   Ambulation/Gait Ambulation/Gait assistance: Min guard Gait Distance (Feet): 180 Feet Assistive device: Rolling walker (2 wheeled) Gait Pattern/deviations: Step-through pattern;Decreased stride length Gait velocity: decreased       Stairs             Wheelchair Mobility    Modified Rankin (Stroke Patients Only)       Balance Overall balance assessment:  Independent Sitting-balance support: No upper extremity supported Sitting balance-Leahy Scale: Good     Standing balance support: During functional activity;Bilateral upper extremity supported Standing balance-Leahy Scale: Fair                              Cognition Arousal/Alertness: Awake/alert Behavior During Therapy: WFL for tasks assessed/performed Overall Cognitive Status: Within Functional Limits for tasks assessed                                        Exercises Total Joint Exercises Ankle Circles/Pumps: AROM;Both;20 reps;Supine Quad Sets: AROM;Right;10 reps;Supine Heel Slides: AROM;Right;10 reps;Supine Hip ABduction/ADduction: AROM;Strengthening;Right;Supine    General Comments        Pertinent Vitals/Pain Pain Score: 5  Pain Location: Rt hip Pain Descriptors / Indicators: Aching;Burning;Discomfort Pain Intervention(s): Limited activity within patient's tolerance;Repositioned;Monitored during session    Home Living                      Prior Function            PT Goals (current goals can now be found in the care plan section) Progress towards PT goals: Progressing toward goals    Frequency    7X/week      PT Plan Current plan remains appropriate    Co-evaluation              AM-PAC PT "6 Clicks" Mobility   Outcome  Measure  Help needed turning from your back to your side while in a flat bed without using bedrails?: A Little Help needed moving from lying on your back to sitting on the side of a flat bed without using bedrails?: A Little Help needed moving to and from a bed to a chair (including a wheelchair)?: A Little Help needed standing up from a chair using your arms (e.g., wheelchair or bedside chair)?: A Little Help needed to walk in hospital room?: A Little Help needed climbing 3-5 steps with a railing? : A Little 6 Click Score: 18    End of Session Equipment Utilized During Treatment: Gait  belt Activity Tolerance: Patient tolerated treatment well Patient left: in chair;with call bell/phone within reach;with chair alarm set Nurse Communication: Mobility status PT Visit Diagnosis: Muscle weakness (generalized) (M62.81);Difficulty in walking, not elsewhere classified (R26.2)     Time: 3491-7915 PT Time Calculation (min) (ACUTE ONLY): 27 min  Charges:  $Gait Training: 8-22 mins $Therapeutic Exercise: 8-22 mins                      Lelon Mast 04/25/2020, 12:43 PM

## 2020-04-26 NOTE — Progress Notes (Signed)
   Subjective: 2 Days Post-Op Procedure(s) (LRB): RIGHT TOTAL HIP ARTHROPLASTY ANTERIOR APPROACH (Right) Patient reports pain as moderate.    Objective: Vital signs in last 24 hours: Temp:  [98.3 F (36.8 C)-99.6 F (37.6 C)] 99.6 F (37.6 C) (05/23 0552) Pulse Rate:  [66-79] 78 (05/23 0606) Resp:  [18-20] 18 (05/23 0552) BP: (133-165)/(78-96) 154/96 (05/23 0606) SpO2:  [94 %-97 %] 94 % (05/23 0552)  Intake/Output from previous day: 05/22 0701 - 05/23 0700 In: 2093.2 [P.O.:1980; I.V.:113.2] Out: 2400 [Urine:2400] Intake/Output this shift: Total I/O In: 240 [P.O.:240] Out: -   Recent Labs    04/25/20 0315  HGB 12.4*   Recent Labs    04/25/20 0315  WBC 12.8*  RBC 4.24  HCT 37.1*  PLT 198   Recent Labs    04/25/20 0315  NA 137  K 3.0*  CL 101  CO2 28  BUN 20  CREATININE 0.75  GLUCOSE 141*  CALCIUM 8.3*   No results for input(s): LABPT, INR in the last 72 hours.  Neurologically intact No results found.  Assessment/Plan: 2 Days Post-Op Procedure(s) (LRB): RIGHT TOTAL HIP ARTHROPLASTY ANTERIOR APPROACH (Right) Plan:    Assistance with nurse to get to bathroom. On commode now. He is fearful of going home and falling. Continue PT. If does well he may be discharged today , if not making progress with PT then he will stay .   Jorge Shaw 04/26/2020, 9:04 AM

## 2020-04-26 NOTE — Progress Notes (Signed)
Physical Therapy Treatment Patient Details Name: Jorge Shaw MRN: [id number] DOB: 1961-10-06 Today's Date: 04/26/2020    History of Present Illness Patient is 59 y.o. male s/p Rt THA anerior approach with PMH significant for HTN, DM, OA.    PT Comments    Pt very cooperative but anxious regarding dc home.  Reviewed HEP, stairs, transfers, bed mobility, car transfers with pt.  At end of session, PT offered second session but pt states "I think I'm ready to go home".   Follow Up Recommendations  Follow surgeon's recommendation for DC plan and follow-up therapies;Home health PT     Equipment Recommendations  Rolling walker with 5" wheels;3in1 (PT)    Recommendations for Other Services       Precautions / Restrictions Precautions Precautions: Anterior Hip Restrictions Weight Bearing Restrictions: No RLE Weight Bearing: Weight bearing as tolerated    Mobility  Bed Mobility Overal bed mobility: Needs Assistance Bed Mobility: Supine to Sit;Sit to Supine     Supine to sit: Min guard;Supervision Sit to supine: Min guard;Supervision   General bed mobility comments: increased time with cues for sequence and use of gait belt to assist management of R LE  Transfers Overall transfer level: Needs assistance Equipment used: Rolling walker (2 wheeled) Transfers: Sit to/from Stand Sit to Stand: Supervision         General transfer comment: cues for LE management and use of UEs to self assist  Ambulation/Gait Ambulation/Gait assistance: Min guard;Supervision Gait Distance (Feet): 150 Feet Assistive device: Rolling walker (2 wheeled) Gait Pattern/deviations: Step-through pattern;Decreased stride length Gait velocity: decreased   General Gait Details: cues for sequence, posture and position from RW   Stairs Stairs: Yes Stairs assistance: Min guard Stair Management: One rail Right;Sideways Number of Stairs: 2 General stair comments: min cues for  technique/sequence   Wheelchair Mobility    Modified Rankin (Stroke Patients Only)       Balance Overall balance assessment: Mild deficits observed, not formally tested Sitting-balance support: No upper extremity supported;Feet supported Sitting balance-Leahy Scale: Good     Standing balance support: During functional activity;Bilateral upper extremity supported Standing balance-Leahy Scale: Fair                              Cognition Arousal/Alertness: Awake/alert Behavior During Therapy: WFL for tasks assessed/performed Overall Cognitive Status: Within Functional Limits for tasks assessed                                        Exercises Total Joint Exercises Ankle Circles/Pumps: AROM;Both;20 reps;Supine Quad Sets: AROM;Right;10 reps;Supine Heel Slides: AROM;Right;10 reps;Supine Hip ABduction/ADduction: AROM;Strengthening;Right;Supine    General Comments        Pertinent Vitals/Pain Pain Assessment: 0-10 Pain Score: 4  Pain Location: Rt hip Pain Descriptors / Indicators: Aching;Burning;Discomfort Pain Intervention(s): Limited activity within patient's tolerance;Monitored during session;Premedicated before session;Ice applied    Home Living                      Prior Function            PT Goals (current goals can now be found in the care plan section) Acute Rehab PT Goals Patient Stated Goal: to recover and get back to work PT Goal Formulation: With patient Time For Goal Achievement: 05/01/20 Potential to Achieve Goals: Good Progress towards PT goals:  Progressing toward goals    Frequency    7X/week      PT Plan Current plan remains appropriate    Co-evaluation              AM-PAC PT "6 Clicks" Mobility   Outcome Measure  Help needed turning from your back to your side while in a flat bed without using bedrails?: A Little Help needed moving from lying on your back to sitting on the side of a flat  bed without using bedrails?: A Little Help needed moving to and from a bed to a chair (including a wheelchair)?: A Little Help needed standing up from a chair using your arms (e.g., wheelchair or bedside chair)?: A Little Help needed to walk in hospital room?: A Little Help needed climbing 3-5 steps with a railing? : A Little 6 Click Score: 18    End of Session Equipment Utilized During Treatment: Gait belt Activity Tolerance: Patient tolerated treatment well Patient left: in chair;with call bell/phone within reach;with chair alarm set Nurse Communication: Mobility status PT Visit Diagnosis: Muscle weakness (generalized) (M62.81);Difficulty in walking, not elsewhere classified (R26.2)     Time: 7673-4193 PT Time Calculation (min) (ACUTE ONLY): 50 min  Charges:  $Gait Training: 8-22 mins $Therapeutic Exercise: 8-22 mins $Therapeutic Activity: 8-22 mins                     Mauro Kaufmann PT Acute Rehabilitation Services Pager [phone] Office [phone]    Robecca Fulgham 04/26/2020, 4:41 PM

## 2020-04-26 NOTE — Plan of Care (Signed)
  Problem: Activity: Goal: Ability to avoid complications of mobility impairment will improve Outcome: Progressing   Problem: Education: Goal: Knowledge of General Education information will improve Description: Including pain rating scale, medication(s)/side effects and non-pharmacologic comfort measures Outcome: Progressing   Problem: Clinical Measurements: Goal: Respiratory complications will improve Outcome: Progressing   Problem: Coping: Goal: Level of anxiety will decrease Outcome: Progressing   Problem: Pain Managment: Goal: General experience of comfort will improve Outcome: Progressing

## 2020-04-26 NOTE — Plan of Care (Signed)
Pt to d/c home with family. No needs at this time.  

## 2020-04-27 ENCOUNTER — Encounter: Payer: Self-pay | Admitting: *Deleted

## 2020-04-27 NOTE — Discharge Summary (Signed)
Patient ID: Jorge Shaw MRN: [id number] DOB/AGE: 1961-08-05 59 y.o.  Admit date: 04/24/2020 Discharge date: 04/27/2020  Admission Diagnoses:  Principal Problem:   Unilateral primary osteoarthritis, right hip Active Problems:   Status post total replacement of right hip   Discharge Diagnoses:  Same  Past Medical History:  Diagnosis Date  . Arthritis   . Diabetes mellitus without complication ([place])   . Dysrhythmia   . Hypertension     Surgeries: Procedure(s): RIGHT TOTAL HIP ARTHROPLASTY ANTERIOR APPROACH on 04/24/2020   Consultants:   Discharged Condition: Improved  Hospital Course: Jorge Shaw is an 59 y.o. male who was admitted 04/24/2020 for operative treatment ofUnilateral primary osteoarthritis, right hip. Patient has severe unremitting pain that affects sleep, daily activities, and work/hobbies. After pre-op clearance the patient was taken to the operating room on 04/24/2020 and underwent  Procedure(s): RIGHT TOTAL HIP ARTHROPLASTY ANTERIOR APPROACH.    Patient was given perioperative antibiotics:  Anti-infectives (From admission, onward)   Start     Dose/Rate Route Frequency Ordered Stop   04/24/20 1730  ceFAZolin (ANCEF) IVPB 2g/100 mL premix     2 g 200 mL/hr over 30 Minutes Intravenous Every 6 hours 04/24/20 1433 04/24/20 2350   04/24/20 0600  ceFAZolin (ANCEF) 3 g in dextrose 5 % 50 mL IVPB     3 g 100 mL/hr over 30 Minutes Intravenous On call to O.R. 04/23/20 0717 04/24/20 1128       Patient was given sequential compression devices, early ambulation, and chemoprophylaxis to prevent DVT.  Patient benefited maximally from hospital stay and there were no complications.    Recent vital signs: No data found.   Recent laboratory studies:  Recent Labs    04/25/20 0315  WBC 12.8*  HGB 12.4*  HCT 37.1*  PLT 198  NA 137  K 3.0*  CL 101  CO2 28  BUN 20  CREATININE 0.75  GLUCOSE 141*  CALCIUM 8.3*     Discharge Medications:   Allergies as of 04/26/2020    No Known Allergies     Medication List    STOP taking these medications   HYDROcodone-acetaminophen 10-325 MG tablet Commonly known as: NORCO     TAKE these medications   amLODipine 10 MG tablet Commonly known as: NORVASC Take 10 mg by mouth daily.   aspirin 81 MG chewable tablet Chew 1 tablet (81 mg total) by mouth 2 (two) times daily. What changed: when to take this   atorvastatin 10 MG tablet Commonly known as: LIPITOR Take 10 mg by mouth every evening.   doxylamine (Sleep) 25 MG tablet Commonly known as: UNISOM Take 50 mg by mouth at bedtime as needed for sleep.   finasteride 5 MG tablet Commonly known as: PROSCAR Take 5 mg by mouth daily.   furosemide 20 MG tablet Commonly known as: LASIX Take 20 mg by mouth daily.   gabapentin 600 MG tablet Commonly known as: NEURONTIN Take 1,200 mg by mouth in the morning, at noon, and at bedtime.   ibuprofen 200 MG tablet Commonly known as: ADVIL Take 800 mg by mouth every 6 (six) hours as needed for headache or moderate pain.   losartan 100 MG tablet Commonly known as: COZAAR Take 100 mg by mouth daily.   methocarbamol 500 MG tablet Commonly known as: ROBAXIN Take 500 mg by mouth 3 (three) times daily. What changed: Another medication with the same name was added. Make sure you understand how and when to take each.   methocarbamol 500  MG tablet Commonly known as: ROBAXIN Take 1 tablet (500 mg total) by mouth every 6 (six) hours as needed for muscle spasms. What changed: You were already taking a medication with the same name, and this prescription was added. Make sure you understand how and when to take each.   metoprolol succinate 100 MG 24 hr tablet Commonly known as: TOPROL-XL Take 100 mg by mouth daily.   oxyCODONE 5 MG immediate release tablet Commonly known as: Oxy IR/ROXICODONE Take 1-2 tablets (5-10 mg total) by mouth every 4 (four) hours as needed for moderate pain (pain score 4-6).   Ozempic (1  MG/DOSE) 2 MG/1.5ML Sopn Generic drug: Semaglutide (1 MG/DOSE) Inject 1 mg into the skin every Sunday.   potassium chloride 10 MEQ tablet Commonly known as: KLOR-CON Take 10 mEq by mouth daily.   tamsulosin 0.4 MG Caps capsule Commonly known as: FLOMAX Take 0.4 mg by mouth daily.   Thera Tabs Take 1 tablet by mouth daily.   VISINE OP Place 1 drop into both eyes daily as needed (irritation).   WART REMOVER EX Apply 1 application topically daily as needed (warts).   zolpidem 10 MG tablet Commonly known as: AMBIEN Take 10 mg by mouth at bedtime as needed for sleep.       Diagnostic Studies: DG Pelvis Portable  Result Date: 04/24/2020 CLINICAL DATA:  Post RIGHT total hip replacement EXAM: PORTABLE PELVIS 1-2 VIEWS COMPARISON:  Portable exam 1319 hours compared to intraoperative images of 04/24/2020 FINDINGS: RIGHT hip prosthesis identified in expected position. No fracture or dislocation. Degenerative changes LEFT hip joint. IMPRESSION: RIGHT hip prosthesis without acute complication. Electronically Signed   By: Ulyses Southward M.D.   On: 04/24/2020 13:31   DG C-Arm 1-60 Min-No Report  Result Date: 04/24/2020 Fluoroscopy was utilized by the requesting physician.  No radiographic interpretation.   DG HIP OPERATIVE UNILAT W OR W/O PELVIS RIGHT  Result Date: 04/24/2020 CLINICAL DATA:  Status post total hip replacement on the right EXAM: OPERATIVE RIGHT HIP (WITH PELVIS IF PERFORMED) 2 VIEWS TECHNIQUE: Fluoroscopic spot image(s) were submitted for interpretation post-operatively. FLUOROSCOPY TIME:  0 MINUTES 29 SECONDS; 8.87 MGY. THREE ACQUIRED IMAGES COMPARISON:  July 11, 2020. FINDINGS: Frontal and lateral views obtained. There is a total hip replacement on the right with prosthetic components well-seated. No fracture or dislocation. There is moderate narrowing of the left hip joint. IMPRESSION: Total hip replacement on the right with prosthetic components well-seated. No fracture or  dislocation. Narrowing left hip joint noted. Electronically Signed   By: Bretta Bang III M.D.   On: 04/24/2020 13:02    Disposition: Discharge disposition: 06-Home-Health Care Svc         Follow-up Information    Kathryne Hitch, MD Follow up in 2 week(s).   Specialty: Orthopedic Surgery Contact information: [address] [place] [address] [phone]        [facility], [facility] Follow up.   Specialty: Home Health Services Why: agency will provide home health physical therapy Contact information: [address] [place] [address] [phone]            Signed: Richardean Canal 04/27/2020, 2:11 PM

## 2020-05-07 ENCOUNTER — Encounter: Payer: Self-pay | Admitting: Orthopaedic Surgery

## 2020-05-07 ENCOUNTER — Ambulatory Visit (INDEPENDENT_AMBULATORY_CARE_PROVIDER_SITE_OTHER): Payer: BC Managed Care – PPO | Admitting: Orthopaedic Surgery

## 2020-05-07 ENCOUNTER — Other Ambulatory Visit: Payer: Self-pay

## 2020-05-07 DIAGNOSIS — Z96641 Presence of right artificial hip joint: Secondary | ICD-10-CM

## 2020-05-07 MED ORDER — OXYCODONE HCL 5 MG PO TABS: 5.0000 mg | ORAL_TABLET | Freq: Four times a day (QID) | ORAL | 0 refills | Status: AC | PRN

## 2020-05-07 MED ORDER — METHOCARBAMOL 500 MG PO TABS: 500.0000 mg | ORAL_TABLET | Freq: Four times a day (QID) | ORAL | 0 refills | Status: AC | PRN

## 2020-05-07 NOTE — Progress Notes (Signed)
The patient comes in today 2 weeks status post a right total hip arthroplasty.  He reports that he is doing well.  He has 2 more physical therapy visits.  He has been working on his mobility and strength.  He reports some tightness of his incision.  On exam, we were able to remove the staples in place Steri-Strips.  There is a moderate seroma.  There is no evidence of infection.  I did drain about 90 cc of fluid from the soft tissue.  This did give him relief.  He understands this will likely recur.  He can go back down to his single aspirin a day which he was on before surgery.  He will continue to increase his activities as comfort allows.  I will refill his Robaxin and oxycodone.  All questions and concerns were answered and addressed.  We will see him back in 4 weeks to see how he is doing overall.

## 2020-05-14 ENCOUNTER — Telehealth: Payer: Self-pay | Admitting: Orthopaedic Surgery

## 2020-05-14 NOTE — Telephone Encounter (Signed)
Mervyn Gay, from [facility] called requesting VO to extend [facility] PT to the following:  2x a week for 2 weeks 1x a week for 2 weeks effective the week of 05/18/20  CB#[phone].  If she does not answer, you can leave a detailed message on her voicemail.  She is the only one that answers that phone.  Thank you.

## 2020-05-15 NOTE — Telephone Encounter (Signed)
Verbal order given  

## 2020-05-21 ENCOUNTER — Telehealth: Payer: Self-pay

## 2020-05-21 NOTE — Telephone Encounter (Signed)
Patient aware.

## 2020-05-21 NOTE — Telephone Encounter (Signed)
Patient would like to know if he could get into the swimming pool?  Patient had right total hip surgery on 04/24/2020.  CB# [phone].  Please advise.  Thank you.

## 2020-05-21 NOTE — Telephone Encounter (Signed)
No absolutely not

## 2020-05-21 NOTE — Telephone Encounter (Signed)
Can you advise for [facility]

## 2020-06-02 ENCOUNTER — Telehealth: Payer: Self-pay | Admitting: Orthopaedic Surgery

## 2020-06-02 NOTE — Telephone Encounter (Signed)
Tammy from [facility] called stating patient is [place] the beach and will not have PT this week. Tammy ohne number is [phone] if any questions or concerns.

## 2020-06-09 ENCOUNTER — Ambulatory Visit (INDEPENDENT_AMBULATORY_CARE_PROVIDER_SITE_OTHER): Payer: BC Managed Care – PPO | Admitting: Orthopaedic Surgery

## 2020-06-09 ENCOUNTER — Encounter: Payer: Self-pay | Admitting: Orthopaedic Surgery

## 2020-06-09 ENCOUNTER — Other Ambulatory Visit: Payer: Self-pay

## 2020-06-09 DIAGNOSIS — Z96641 Presence of right artificial hip joint: Secondary | ICD-10-CM

## 2020-06-09 MED ORDER — HYDROCODONE-ACETAMINOPHEN 5-325 MG PO TABS: 1.0000 | ORAL_TABLET | Freq: Four times a day (QID) | ORAL | 0 refills | Status: AC | PRN

## 2020-06-09 NOTE — Progress Notes (Signed)
The patient is now 6 weeks status post a right total hip arthroplasty.  He is someone who does work for [employer].  He has been making good progress.  He is ambling with a cane.  He has pain in his hip when his been standing or walking too long after therapy but overall has made good progress.  He would like to be cleared to drive as well.  He does need a pain medicine prescription refill.  He is on oxycodone but we have told him we would like to wean him to hydrocodone at this standpoint.  I did counsel him about driving stating that he needs not have a narcotic in his system when he gets behind the wheel.  He is also clear to swim from my standpoint.  On examination his right hip incision looks good.  He tolerates me putting his right hip through internal and external rotation with no significant difficulties.  From my standpoint if he is doing well enough he can return to full work duty starting after 12 August.  If there is any issues at that point he will let us know.  Also do not need to see him back for 6 months unless there is issues.  At that visit I like a standing low AP pelvis and lateral of his right operative hip.

## 2020-06-12 ENCOUNTER — Telehealth: Payer: Self-pay | Admitting: Orthopaedic Surgery

## 2020-06-12 NOTE — Telephone Encounter (Signed)
Jorge Shaw with [facility] would like to put in a order for PT with the frequency on once a week for 1 week.   [phone]

## 2020-06-12 NOTE — Telephone Encounter (Signed)
Verbal left on VM 

## 2020-06-25 ENCOUNTER — Telehealth: Payer: Self-pay | Admitting: Orthopaedic Surgery

## 2020-06-25 NOTE — Telephone Encounter (Signed)
Add note for patient. Patient called back with fax number to employer. Please fax doctor's note to [phone].

## 2020-06-25 NOTE — Telephone Encounter (Signed)
Hold message for fax number. Message sent to [facility] to ask about extension of time out of work.

## 2020-06-25 NOTE — Telephone Encounter (Signed)
Patient called requesting a extension of work note to be out of work until Sept 13,2021. Patient states he doesn't feel he will be ready to return to work. Aug. 12, 2021. Patient also asked if this updated doctor's note can be faxed to employer [employer] on file. Please call patient back at [phone]. Please send doctor's note to patient's mychart

## 2020-06-25 NOTE — Telephone Encounter (Signed)
Pt is s/p a right total hip replacement on 04/15/20. He states that he does not feel that he will be able to return to work on 07/16/20 and is requesting a note to be out until the second week of September. Ok to write note?

## 2020-06-29 NOTE — Telephone Encounter (Signed)
Done  Sign off on other note

## 2020-06-29 NOTE — Telephone Encounter (Signed)
Faxed to provided number  

## 2020-07-01 ENCOUNTER — Telehealth: Payer: Self-pay | Admitting: Orthopaedic Surgery

## 2020-07-01 NOTE — Telephone Encounter (Signed)
[employer] forms received. Sent to [employer]. 

## 2020-07-06 ENCOUNTER — Telehealth: Payer: Self-pay

## 2020-07-06 NOTE — Telephone Encounter (Signed)
Patient called in wanting to discuss about his recent visits for his short term disability

## 2020-07-06 NOTE — Telephone Encounter (Signed)
Attempted to contact patient, went to voicemail, voicemail full, unable to leave message.

## 2020-07-23 ENCOUNTER — Telehealth: Payer: Self-pay | Admitting: Orthopaedic Surgery

## 2020-07-23 NOTE — Telephone Encounter (Signed)
IC, unable to lve msg. Forms have been signed. I faxed to [facility]. And forms went back to [employer]

## 2020-07-23 NOTE — Telephone Encounter (Signed)
I'm not 100% sure who to send these messages to?

## 2020-07-23 NOTE — Telephone Encounter (Signed)
Patient called  He was told by [employer] that his paperwork was just pending a signature from Dr.Blackman. He was calling to check the status of the signature.   Call back:[phone]

## 2020-08-11 ENCOUNTER — Telehealth: Payer: Self-pay | Admitting: Orthopaedic Surgery

## 2020-08-11 NOTE — Telephone Encounter (Signed)
Pt called stating he would like a return to work note faxed to his job and [employer]; he would like for the note to list all his restrictions.   Sunnie Nielsen Fax# [phone] [employer] Fax# [phone]

## 2020-08-11 NOTE — Telephone Encounter (Signed)
Faxed last note to provided numbers

## 2020-08-14 ENCOUNTER — Telehealth: Payer: Self-pay | Admitting: Orthopaedic Surgery

## 2020-08-14 NOTE — Telephone Encounter (Signed)
Note faxed to both numbers provided. LMVM for patient advising this was done.

## 2020-08-14 NOTE — Telephone Encounter (Signed)
Patient called advised the return to work note was not received. Patient asked if the note can be faxed again?  Patient asked for a call when note is faxed again. Sunnie [employer]  Fax# [phone]   [employer]  Fax# [phone]  The number to contact patient is [phone]

## 2020-09-07 IMAGING — RF DG HIP (WITH PELVIS) OPERATIVE*R*
1 series · 3 of 3 positions shown · non-contrast
Comparison: July 11, 2020.

CLINICAL DATA: Status post total hip replacement on the right

EXAM:
OPERATIVE RIGHT HIP (WITH PELVIS IF PERFORMED) 2 VIEWS
TECHNIQUE: Fluoroscopic spot image(s) were submitted for interpretation
post-operatively.
FLUOROSCOPY TIME:  0 MINUTES 29 SECONDS; 8.87 MGY. THREE ACQUIRED
IMAGES

[Series 1: unknown protocol · 0.20mm/px · 3 of 3 slices shown]
[im 1/3]
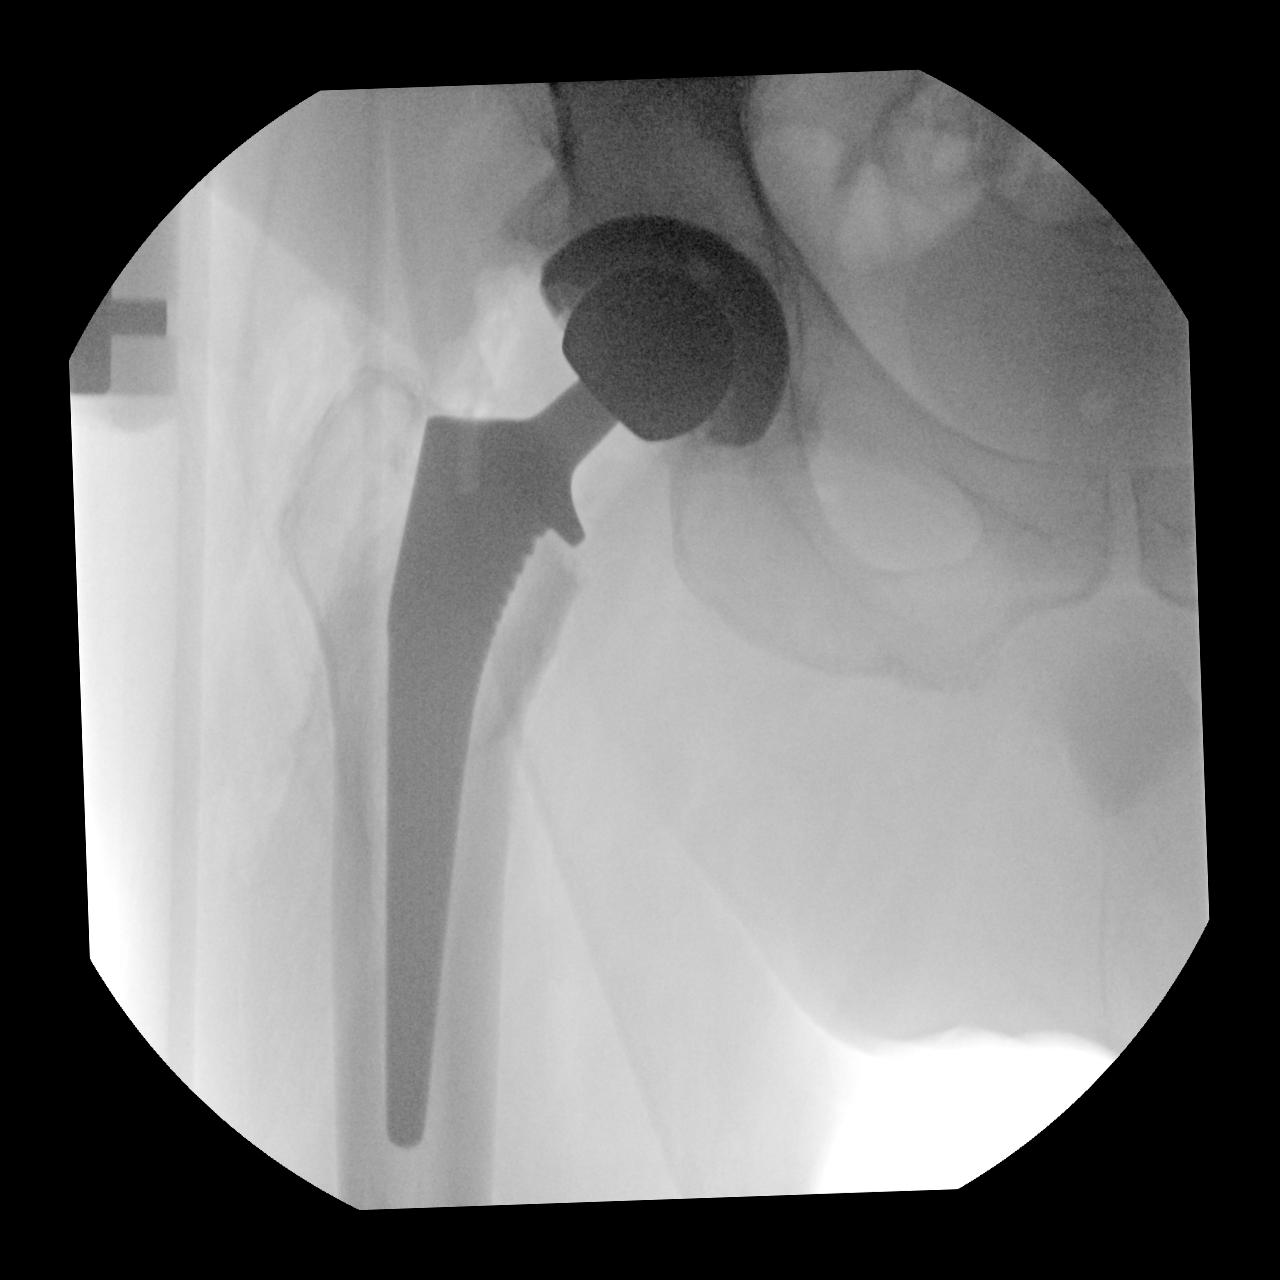
[im 2/3]
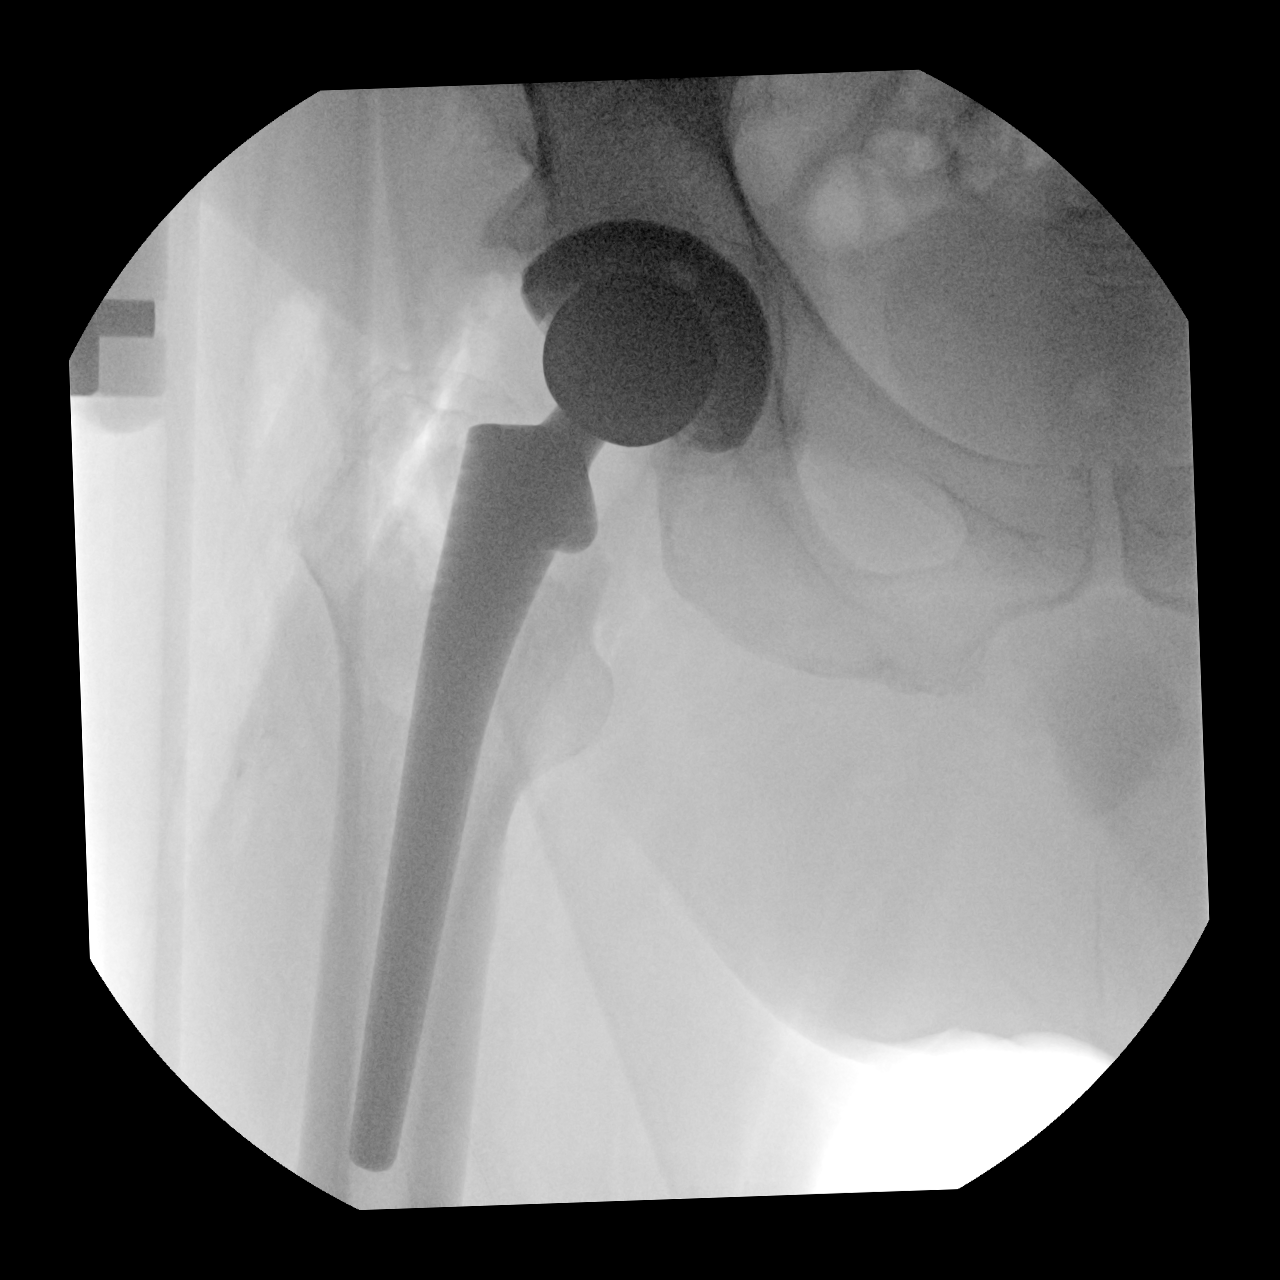
[im 3/3]
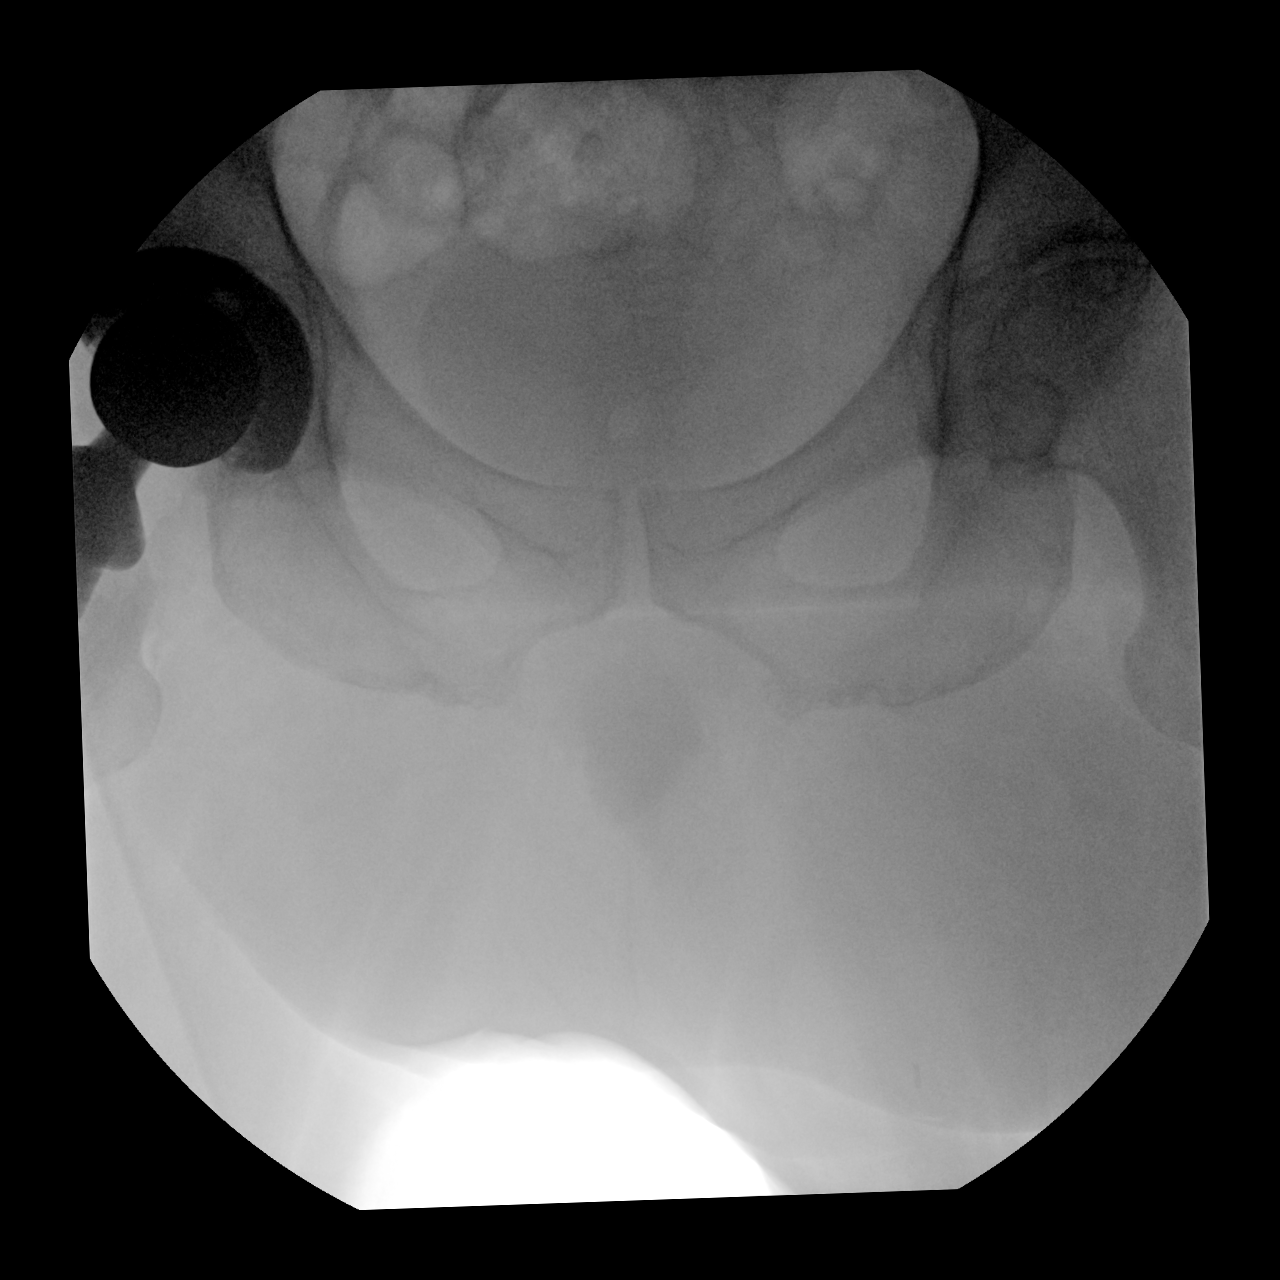

[3 of 3 positions shown; findings below may reference images not displayed]

FINDINGS: Frontal and lateral views obtained. There is a total hip replacement
on the right with prosthetic components well-seated. No fracture or
dislocation. There is moderate narrowing of the left hip joint.
IMPRESSION: Total hip replacement on the right with prosthetic components
well-seated. No fracture or dislocation. Narrowing left hip joint
noted.

## 2020-12-10 ENCOUNTER — Encounter: Payer: Self-pay | Admitting: Orthopaedic Surgery

## 2020-12-10 ENCOUNTER — Ambulatory Visit: Payer: BC Managed Care – PPO | Admitting: Orthopaedic Surgery

## 2020-12-10 ENCOUNTER — Ambulatory Visit: Payer: Self-pay

## 2020-12-10 ENCOUNTER — Other Ambulatory Visit: Payer: Self-pay

## 2020-12-10 DIAGNOSIS — Z96641 Presence of right artificial hip joint: Secondary | ICD-10-CM | POA: Diagnosis not present

## 2020-12-10 NOTE — Progress Notes (Signed)
The patient comes in today status post a total hip arthroplasty that we did in May 2021.  He is doing very well and has good motion and strength.  He is someone who weighs 350 pounds.  On exam I can put his right operative hip easily through internal and external rotation with no difficulty at all.  He has had no acute change in medical status.  He denies any fever or chills.  An AP pelvis and lateral of his right operative hip shows a well-seated implant with no complicating features.  At this point follow-up can be as needed since he is doing well.  He understands completely that if he develops any issues with that right hip we need to see him as soon as possible.  All questions and concerns were answered and addressed.

## 2021-03-05 DEATH — deceased
